# Patient Record
Sex: Male | Born: 1940 | Race: White | Hispanic: No | Marital: Married | Smoking: Never smoker
Health system: Southern US, Community
[De-identification: ages and names within clinical notes are randomized; demographics above are authoritative.]

## PROBLEM LIST (undated history)

## (undated) DIAGNOSIS — C61 Malignant neoplasm of prostate: Secondary | ICD-10-CM

## (undated) DIAGNOSIS — G4730 Sleep apnea, unspecified: Secondary | ICD-10-CM

## (undated) DIAGNOSIS — I10 Essential (primary) hypertension: Secondary | ICD-10-CM

## (undated) DIAGNOSIS — N189 Chronic kidney disease, unspecified: Secondary | ICD-10-CM

## (undated) DIAGNOSIS — I4510 Unspecified right bundle-branch block: Secondary | ICD-10-CM

## (undated) DIAGNOSIS — E785 Hyperlipidemia, unspecified: Secondary | ICD-10-CM

## (undated) DIAGNOSIS — H269 Unspecified cataract: Secondary | ICD-10-CM

## (undated) HISTORY — DX: Malignant neoplasm of prostate: C61

## (undated) HISTORY — DX: Essential (primary) hypertension: I10

## (undated) HISTORY — DX: Unspecified cataract: H26.9

## (undated) HISTORY — DX: Hyperlipidemia, unspecified: E78.5

## (undated) HISTORY — DX: Chronic kidney disease, unspecified: N18.9

## (undated) HISTORY — DX: Sleep apnea, unspecified: G47.30

---

## 2003-08-19 HISTORY — PX: PROSTATE SURGERY: SHX751

## 2015-09-18 DIAGNOSIS — I10 Essential (primary) hypertension: Secondary | ICD-10-CM | POA: Insufficient documentation

## 2015-09-18 DIAGNOSIS — J309 Allergic rhinitis, unspecified: Secondary | ICD-10-CM | POA: Insufficient documentation

## 2015-09-18 DIAGNOSIS — C61 Malignant neoplasm of prostate: Secondary | ICD-10-CM | POA: Insufficient documentation

## 2015-09-18 DIAGNOSIS — E7800 Pure hypercholesterolemia, unspecified: Secondary | ICD-10-CM | POA: Insufficient documentation

## 2015-09-18 DIAGNOSIS — K2100 Gastro-esophageal reflux disease with esophagitis, without bleeding: Secondary | ICD-10-CM | POA: Insufficient documentation

## 2015-09-18 DIAGNOSIS — R7301 Impaired fasting glucose: Secondary | ICD-10-CM | POA: Insufficient documentation

## 2016-08-20 DIAGNOSIS — R69 Illness, unspecified: Secondary | ICD-10-CM | POA: Diagnosis not present

## 2016-10-23 DIAGNOSIS — Z08 Encounter for follow-up examination after completed treatment for malignant neoplasm: Secondary | ICD-10-CM | POA: Diagnosis not present

## 2016-10-23 DIAGNOSIS — Z85828 Personal history of other malignant neoplasm of skin: Secondary | ICD-10-CM | POA: Diagnosis not present

## 2017-03-24 DIAGNOSIS — Z85828 Personal history of other malignant neoplasm of skin: Secondary | ICD-10-CM | POA: Diagnosis not present

## 2017-03-24 DIAGNOSIS — L820 Inflamed seborrheic keratosis: Secondary | ICD-10-CM | POA: Diagnosis not present

## 2017-03-24 DIAGNOSIS — Z08 Encounter for follow-up examination after completed treatment for malignant neoplasm: Secondary | ICD-10-CM | POA: Diagnosis not present

## 2017-03-24 DIAGNOSIS — L570 Actinic keratosis: Secondary | ICD-10-CM | POA: Diagnosis not present

## 2017-03-31 DIAGNOSIS — I10 Essential (primary) hypertension: Secondary | ICD-10-CM | POA: Diagnosis not present

## 2017-03-31 DIAGNOSIS — Z0000 Encounter for general adult medical examination without abnormal findings: Secondary | ICD-10-CM | POA: Diagnosis not present

## 2017-03-31 DIAGNOSIS — E7800 Pure hypercholesterolemia, unspecified: Secondary | ICD-10-CM | POA: Diagnosis not present

## 2017-04-03 DIAGNOSIS — Z23 Encounter for immunization: Secondary | ICD-10-CM | POA: Diagnosis not present

## 2017-04-03 DIAGNOSIS — E7800 Pure hypercholesterolemia, unspecified: Secondary | ICD-10-CM | POA: Diagnosis not present

## 2017-04-03 DIAGNOSIS — Z79899 Other long term (current) drug therapy: Secondary | ICD-10-CM | POA: Diagnosis not present

## 2017-04-03 DIAGNOSIS — Z8546 Personal history of malignant neoplasm of prostate: Secondary | ICD-10-CM | POA: Diagnosis not present

## 2017-04-03 DIAGNOSIS — R69 Illness, unspecified: Secondary | ICD-10-CM | POA: Diagnosis not present

## 2017-04-03 DIAGNOSIS — I10 Essential (primary) hypertension: Secondary | ICD-10-CM | POA: Diagnosis not present

## 2017-04-03 DIAGNOSIS — J302 Other seasonal allergic rhinitis: Secondary | ICD-10-CM | POA: Diagnosis not present

## 2017-04-03 DIAGNOSIS — K210 Gastro-esophageal reflux disease with esophagitis: Secondary | ICD-10-CM | POA: Diagnosis not present

## 2017-04-03 DIAGNOSIS — R7301 Impaired fasting glucose: Secondary | ICD-10-CM | POA: Diagnosis not present

## 2017-04-03 DIAGNOSIS — Z0000 Encounter for general adult medical examination without abnormal findings: Secondary | ICD-10-CM | POA: Diagnosis not present

## 2017-08-20 DIAGNOSIS — R69 Illness, unspecified: Secondary | ICD-10-CM | POA: Diagnosis not present

## 2017-10-05 DIAGNOSIS — H524 Presbyopia: Secondary | ICD-10-CM | POA: Diagnosis not present

## 2017-10-05 DIAGNOSIS — H25043 Posterior subcapsular polar age-related cataract, bilateral: Secondary | ICD-10-CM | POA: Diagnosis not present

## 2017-10-05 DIAGNOSIS — H52203 Unspecified astigmatism, bilateral: Secondary | ICD-10-CM | POA: Diagnosis not present

## 2017-10-05 DIAGNOSIS — H2513 Age-related nuclear cataract, bilateral: Secondary | ICD-10-CM | POA: Diagnosis not present

## 2017-10-05 DIAGNOSIS — H5213 Myopia, bilateral: Secondary | ICD-10-CM | POA: Diagnosis not present

## 2017-10-12 DIAGNOSIS — Z0100 Encounter for examination of eyes and vision without abnormal findings: Secondary | ICD-10-CM | POA: Diagnosis not present

## 2017-12-05 DIAGNOSIS — J208 Acute bronchitis due to other specified organisms: Secondary | ICD-10-CM | POA: Diagnosis not present

## 2017-12-05 DIAGNOSIS — B9689 Other specified bacterial agents as the cause of diseases classified elsewhere: Secondary | ICD-10-CM | POA: Diagnosis not present

## 2018-01-19 DIAGNOSIS — Z85828 Personal history of other malignant neoplasm of skin: Secondary | ICD-10-CM | POA: Diagnosis not present

## 2018-01-19 DIAGNOSIS — Z08 Encounter for follow-up examination after completed treatment for malignant neoplasm: Secondary | ICD-10-CM | POA: Diagnosis not present

## 2018-01-19 DIAGNOSIS — Z86008 Personal history of in-situ neoplasm of other site: Secondary | ICD-10-CM | POA: Diagnosis not present

## 2018-01-19 DIAGNOSIS — L821 Other seborrheic keratosis: Secondary | ICD-10-CM | POA: Diagnosis not present

## 2018-01-19 DIAGNOSIS — L720 Epidermal cyst: Secondary | ICD-10-CM | POA: Diagnosis not present

## 2018-01-19 DIAGNOSIS — L570 Actinic keratosis: Secondary | ICD-10-CM | POA: Diagnosis not present

## 2018-03-04 DIAGNOSIS — M9904 Segmental and somatic dysfunction of sacral region: Secondary | ICD-10-CM | POA: Diagnosis not present

## 2018-03-04 DIAGNOSIS — S335XXA Sprain of ligaments of lumbar spine, initial encounter: Secondary | ICD-10-CM | POA: Diagnosis not present

## 2018-03-04 DIAGNOSIS — M9903 Segmental and somatic dysfunction of lumbar region: Secondary | ICD-10-CM | POA: Diagnosis not present

## 2018-03-04 DIAGNOSIS — M9902 Segmental and somatic dysfunction of thoracic region: Secondary | ICD-10-CM | POA: Diagnosis not present

## 2018-03-08 DIAGNOSIS — M9902 Segmental and somatic dysfunction of thoracic region: Secondary | ICD-10-CM | POA: Diagnosis not present

## 2018-03-08 DIAGNOSIS — S335XXA Sprain of ligaments of lumbar spine, initial encounter: Secondary | ICD-10-CM | POA: Diagnosis not present

## 2018-03-08 DIAGNOSIS — M9903 Segmental and somatic dysfunction of lumbar region: Secondary | ICD-10-CM | POA: Diagnosis not present

## 2018-03-08 DIAGNOSIS — M9904 Segmental and somatic dysfunction of sacral region: Secondary | ICD-10-CM | POA: Diagnosis not present

## 2018-03-15 DIAGNOSIS — S335XXA Sprain of ligaments of lumbar spine, initial encounter: Secondary | ICD-10-CM | POA: Diagnosis not present

## 2018-03-15 DIAGNOSIS — M9902 Segmental and somatic dysfunction of thoracic region: Secondary | ICD-10-CM | POA: Diagnosis not present

## 2018-03-15 DIAGNOSIS — M9903 Segmental and somatic dysfunction of lumbar region: Secondary | ICD-10-CM | POA: Diagnosis not present

## 2018-03-15 DIAGNOSIS — M9904 Segmental and somatic dysfunction of sacral region: Secondary | ICD-10-CM | POA: Diagnosis not present

## 2018-03-18 DIAGNOSIS — S335XXA Sprain of ligaments of lumbar spine, initial encounter: Secondary | ICD-10-CM | POA: Diagnosis not present

## 2018-03-18 DIAGNOSIS — M9902 Segmental and somatic dysfunction of thoracic region: Secondary | ICD-10-CM | POA: Diagnosis not present

## 2018-03-18 DIAGNOSIS — M9903 Segmental and somatic dysfunction of lumbar region: Secondary | ICD-10-CM | POA: Diagnosis not present

## 2018-03-18 DIAGNOSIS — M9904 Segmental and somatic dysfunction of sacral region: Secondary | ICD-10-CM | POA: Diagnosis not present

## 2018-03-23 DIAGNOSIS — M9903 Segmental and somatic dysfunction of lumbar region: Secondary | ICD-10-CM | POA: Diagnosis not present

## 2018-03-23 DIAGNOSIS — S335XXA Sprain of ligaments of lumbar spine, initial encounter: Secondary | ICD-10-CM | POA: Diagnosis not present

## 2018-03-23 DIAGNOSIS — M9902 Segmental and somatic dysfunction of thoracic region: Secondary | ICD-10-CM | POA: Diagnosis not present

## 2018-03-23 DIAGNOSIS — M9904 Segmental and somatic dysfunction of sacral region: Secondary | ICD-10-CM | POA: Diagnosis not present

## 2018-03-29 DIAGNOSIS — M9904 Segmental and somatic dysfunction of sacral region: Secondary | ICD-10-CM | POA: Diagnosis not present

## 2018-03-29 DIAGNOSIS — M9903 Segmental and somatic dysfunction of lumbar region: Secondary | ICD-10-CM | POA: Diagnosis not present

## 2018-03-29 DIAGNOSIS — M9902 Segmental and somatic dysfunction of thoracic region: Secondary | ICD-10-CM | POA: Diagnosis not present

## 2018-03-29 DIAGNOSIS — S335XXA Sprain of ligaments of lumbar spine, initial encounter: Secondary | ICD-10-CM | POA: Diagnosis not present

## 2018-06-08 DIAGNOSIS — I10 Essential (primary) hypertension: Secondary | ICD-10-CM | POA: Diagnosis not present

## 2018-06-08 DIAGNOSIS — Z0000 Encounter for general adult medical examination without abnormal findings: Secondary | ICD-10-CM | POA: Diagnosis not present

## 2018-06-08 DIAGNOSIS — C61 Malignant neoplasm of prostate: Secondary | ICD-10-CM | POA: Diagnosis not present

## 2018-06-08 DIAGNOSIS — R7301 Impaired fasting glucose: Secondary | ICD-10-CM | POA: Diagnosis not present

## 2018-06-08 DIAGNOSIS — K210 Gastro-esophageal reflux disease with esophagitis: Secondary | ICD-10-CM | POA: Diagnosis not present

## 2018-06-08 DIAGNOSIS — J302 Other seasonal allergic rhinitis: Secondary | ICD-10-CM | POA: Diagnosis not present

## 2018-06-08 DIAGNOSIS — E7800 Pure hypercholesterolemia, unspecified: Secondary | ICD-10-CM | POA: Diagnosis not present

## 2018-06-08 DIAGNOSIS — R69 Illness, unspecified: Secondary | ICD-10-CM | POA: Diagnosis not present

## 2018-06-15 DIAGNOSIS — I10 Essential (primary) hypertension: Secondary | ICD-10-CM | POA: Diagnosis not present

## 2018-06-15 DIAGNOSIS — Z0000 Encounter for general adult medical examination without abnormal findings: Secondary | ICD-10-CM | POA: Diagnosis not present

## 2018-06-15 DIAGNOSIS — E7800 Pure hypercholesterolemia, unspecified: Secondary | ICD-10-CM | POA: Diagnosis not present

## 2018-07-26 DIAGNOSIS — D485 Neoplasm of uncertain behavior of skin: Secondary | ICD-10-CM | POA: Diagnosis not present

## 2018-07-26 DIAGNOSIS — L905 Scar conditions and fibrosis of skin: Secondary | ICD-10-CM | POA: Diagnosis not present

## 2018-08-19 DIAGNOSIS — R69 Illness, unspecified: Secondary | ICD-10-CM | POA: Diagnosis not present

## 2018-10-07 DIAGNOSIS — H5213 Myopia, bilateral: Secondary | ICD-10-CM | POA: Diagnosis not present

## 2018-10-07 DIAGNOSIS — W19XXXA Unspecified fall, initial encounter: Secondary | ICD-10-CM | POA: Diagnosis not present

## 2018-10-07 DIAGNOSIS — H11153 Pinguecula, bilateral: Secondary | ICD-10-CM | POA: Diagnosis not present

## 2018-10-07 DIAGNOSIS — H52203 Unspecified astigmatism, bilateral: Secondary | ICD-10-CM | POA: Diagnosis not present

## 2018-10-07 DIAGNOSIS — R61 Generalized hyperhidrosis: Secondary | ICD-10-CM | POA: Diagnosis not present

## 2018-10-07 DIAGNOSIS — H2513 Age-related nuclear cataract, bilateral: Secondary | ICD-10-CM | POA: Diagnosis not present

## 2018-10-07 DIAGNOSIS — H524 Presbyopia: Secondary | ICD-10-CM | POA: Diagnosis not present

## 2018-10-07 DIAGNOSIS — H25043 Posterior subcapsular polar age-related cataract, bilateral: Secondary | ICD-10-CM | POA: Diagnosis not present

## 2018-10-12 DIAGNOSIS — R55 Syncope and collapse: Secondary | ICD-10-CM | POA: Insufficient documentation

## 2018-11-08 DIAGNOSIS — R55 Syncope and collapse: Secondary | ICD-10-CM | POA: Diagnosis not present

## 2018-11-08 DIAGNOSIS — R0681 Apnea, not elsewhere classified: Secondary | ICD-10-CM | POA: Insufficient documentation

## 2018-11-17 DIAGNOSIS — R55 Syncope and collapse: Secondary | ICD-10-CM | POA: Diagnosis not present

## 2018-11-17 DIAGNOSIS — G4733 Obstructive sleep apnea (adult) (pediatric): Secondary | ICD-10-CM | POA: Diagnosis not present

## 2018-11-20 DIAGNOSIS — G4733 Obstructive sleep apnea (adult) (pediatric): Secondary | ICD-10-CM | POA: Insufficient documentation

## 2018-11-22 DIAGNOSIS — C61 Malignant neoplasm of prostate: Secondary | ICD-10-CM | POA: Diagnosis not present

## 2018-11-22 DIAGNOSIS — R55 Syncope and collapse: Secondary | ICD-10-CM | POA: Diagnosis not present

## 2018-11-22 DIAGNOSIS — I10 Essential (primary) hypertension: Secondary | ICD-10-CM | POA: Diagnosis not present

## 2018-11-22 DIAGNOSIS — E7800 Pure hypercholesterolemia, unspecified: Secondary | ICD-10-CM | POA: Diagnosis not present

## 2018-11-22 DIAGNOSIS — G4733 Obstructive sleep apnea (adult) (pediatric): Secondary | ICD-10-CM | POA: Diagnosis not present

## 2018-11-29 DIAGNOSIS — R55 Syncope and collapse: Secondary | ICD-10-CM | POA: Diagnosis not present

## 2018-12-03 DIAGNOSIS — R55 Syncope and collapse: Secondary | ICD-10-CM | POA: Diagnosis not present

## 2019-01-19 DIAGNOSIS — R55 Syncope and collapse: Secondary | ICD-10-CM | POA: Diagnosis not present

## 2019-01-19 DIAGNOSIS — G4733 Obstructive sleep apnea (adult) (pediatric): Secondary | ICD-10-CM | POA: Diagnosis not present

## 2019-01-19 DIAGNOSIS — K210 Gastro-esophageal reflux disease with esophagitis: Secondary | ICD-10-CM | POA: Diagnosis not present

## 2019-01-19 DIAGNOSIS — C61 Malignant neoplasm of prostate: Secondary | ICD-10-CM | POA: Diagnosis not present

## 2019-01-19 DIAGNOSIS — I10 Essential (primary) hypertension: Secondary | ICD-10-CM | POA: Diagnosis not present

## 2019-01-21 DIAGNOSIS — Z20828 Contact with and (suspected) exposure to other viral communicable diseases: Secondary | ICD-10-CM | POA: Diagnosis not present

## 2019-02-14 DIAGNOSIS — S335XXA Sprain of ligaments of lumbar spine, initial encounter: Secondary | ICD-10-CM | POA: Diagnosis not present

## 2019-02-14 DIAGNOSIS — M9902 Segmental and somatic dysfunction of thoracic region: Secondary | ICD-10-CM | POA: Diagnosis not present

## 2019-02-14 DIAGNOSIS — M9903 Segmental and somatic dysfunction of lumbar region: Secondary | ICD-10-CM | POA: Diagnosis not present

## 2019-02-14 DIAGNOSIS — M9904 Segmental and somatic dysfunction of sacral region: Secondary | ICD-10-CM | POA: Diagnosis not present

## 2019-02-15 DIAGNOSIS — M9902 Segmental and somatic dysfunction of thoracic region: Secondary | ICD-10-CM | POA: Diagnosis not present

## 2019-02-15 DIAGNOSIS — M9904 Segmental and somatic dysfunction of sacral region: Secondary | ICD-10-CM | POA: Diagnosis not present

## 2019-02-15 DIAGNOSIS — S335XXA Sprain of ligaments of lumbar spine, initial encounter: Secondary | ICD-10-CM | POA: Diagnosis not present

## 2019-02-15 DIAGNOSIS — M9903 Segmental and somatic dysfunction of lumbar region: Secondary | ICD-10-CM | POA: Diagnosis not present

## 2019-02-16 DIAGNOSIS — M9904 Segmental and somatic dysfunction of sacral region: Secondary | ICD-10-CM | POA: Diagnosis not present

## 2019-02-16 DIAGNOSIS — M9902 Segmental and somatic dysfunction of thoracic region: Secondary | ICD-10-CM | POA: Diagnosis not present

## 2019-02-16 DIAGNOSIS — M9903 Segmental and somatic dysfunction of lumbar region: Secondary | ICD-10-CM | POA: Diagnosis not present

## 2019-02-16 DIAGNOSIS — S335XXA Sprain of ligaments of lumbar spine, initial encounter: Secondary | ICD-10-CM | POA: Diagnosis not present

## 2019-02-17 DIAGNOSIS — M9903 Segmental and somatic dysfunction of lumbar region: Secondary | ICD-10-CM | POA: Diagnosis not present

## 2019-02-17 DIAGNOSIS — M9904 Segmental and somatic dysfunction of sacral region: Secondary | ICD-10-CM | POA: Diagnosis not present

## 2019-02-17 DIAGNOSIS — S335XXA Sprain of ligaments of lumbar spine, initial encounter: Secondary | ICD-10-CM | POA: Diagnosis not present

## 2019-02-17 DIAGNOSIS — M9902 Segmental and somatic dysfunction of thoracic region: Secondary | ICD-10-CM | POA: Diagnosis not present

## 2019-02-21 DIAGNOSIS — M9903 Segmental and somatic dysfunction of lumbar region: Secondary | ICD-10-CM | POA: Diagnosis not present

## 2019-02-21 DIAGNOSIS — S335XXA Sprain of ligaments of lumbar spine, initial encounter: Secondary | ICD-10-CM | POA: Diagnosis not present

## 2019-02-21 DIAGNOSIS — M9902 Segmental and somatic dysfunction of thoracic region: Secondary | ICD-10-CM | POA: Diagnosis not present

## 2019-02-21 DIAGNOSIS — M9904 Segmental and somatic dysfunction of sacral region: Secondary | ICD-10-CM | POA: Diagnosis not present

## 2019-02-24 DIAGNOSIS — S335XXA Sprain of ligaments of lumbar spine, initial encounter: Secondary | ICD-10-CM | POA: Diagnosis not present

## 2019-02-24 DIAGNOSIS — M9904 Segmental and somatic dysfunction of sacral region: Secondary | ICD-10-CM | POA: Diagnosis not present

## 2019-02-24 DIAGNOSIS — M9903 Segmental and somatic dysfunction of lumbar region: Secondary | ICD-10-CM | POA: Diagnosis not present

## 2019-02-24 DIAGNOSIS — M9902 Segmental and somatic dysfunction of thoracic region: Secondary | ICD-10-CM | POA: Diagnosis not present

## 2019-03-01 DIAGNOSIS — M5127 Other intervertebral disc displacement, lumbosacral region: Secondary | ICD-10-CM | POA: Diagnosis not present

## 2019-03-01 DIAGNOSIS — M5416 Radiculopathy, lumbar region: Secondary | ICD-10-CM | POA: Diagnosis not present

## 2019-03-01 DIAGNOSIS — M549 Dorsalgia, unspecified: Secondary | ICD-10-CM | POA: Diagnosis not present

## 2019-03-01 DIAGNOSIS — M5136 Other intervertebral disc degeneration, lumbar region: Secondary | ICD-10-CM | POA: Diagnosis not present

## 2019-03-01 DIAGNOSIS — G8929 Other chronic pain: Secondary | ICD-10-CM | POA: Diagnosis not present

## 2019-03-01 DIAGNOSIS — M62830 Muscle spasm of back: Secondary | ICD-10-CM | POA: Diagnosis not present

## 2019-03-09 DIAGNOSIS — Z0100 Encounter for examination of eyes and vision without abnormal findings: Secondary | ICD-10-CM | POA: Diagnosis not present

## 2019-03-15 DIAGNOSIS — G8929 Other chronic pain: Secondary | ICD-10-CM | POA: Diagnosis not present

## 2019-03-15 DIAGNOSIS — M5127 Other intervertebral disc displacement, lumbosacral region: Secondary | ICD-10-CM | POA: Diagnosis not present

## 2019-03-15 DIAGNOSIS — M5136 Other intervertebral disc degeneration, lumbar region: Secondary | ICD-10-CM | POA: Diagnosis not present

## 2019-03-15 DIAGNOSIS — M62830 Muscle spasm of back: Secondary | ICD-10-CM | POA: Diagnosis not present

## 2019-03-15 DIAGNOSIS — M549 Dorsalgia, unspecified: Secondary | ICD-10-CM | POA: Diagnosis not present

## 2019-03-15 DIAGNOSIS — M5416 Radiculopathy, lumbar region: Secondary | ICD-10-CM | POA: Diagnosis not present

## 2019-04-01 DIAGNOSIS — R55 Syncope and collapse: Secondary | ICD-10-CM | POA: Diagnosis not present

## 2019-04-01 DIAGNOSIS — G8929 Other chronic pain: Secondary | ICD-10-CM | POA: Insufficient documentation

## 2019-04-01 DIAGNOSIS — M5441 Lumbago with sciatica, right side: Secondary | ICD-10-CM | POA: Diagnosis not present

## 2019-04-01 DIAGNOSIS — M5440 Lumbago with sciatica, unspecified side: Secondary | ICD-10-CM | POA: Insufficient documentation

## 2019-04-12 DIAGNOSIS — M5136 Other intervertebral disc degeneration, lumbar region: Secondary | ICD-10-CM | POA: Diagnosis not present

## 2019-04-12 DIAGNOSIS — M62830 Muscle spasm of back: Secondary | ICD-10-CM | POA: Diagnosis not present

## 2019-04-12 DIAGNOSIS — M5416 Radiculopathy, lumbar region: Secondary | ICD-10-CM | POA: Diagnosis not present

## 2019-04-12 DIAGNOSIS — G8929 Other chronic pain: Secondary | ICD-10-CM | POA: Diagnosis not present

## 2019-04-12 DIAGNOSIS — M5127 Other intervertebral disc displacement, lumbosacral region: Secondary | ICD-10-CM | POA: Diagnosis not present

## 2019-04-12 DIAGNOSIS — M549 Dorsalgia, unspecified: Secondary | ICD-10-CM | POA: Diagnosis not present

## 2019-04-30 DIAGNOSIS — M5136 Other intervertebral disc degeneration, lumbar region: Secondary | ICD-10-CM | POA: Diagnosis not present

## 2019-04-30 DIAGNOSIS — M549 Dorsalgia, unspecified: Secondary | ICD-10-CM | POA: Diagnosis not present

## 2019-04-30 DIAGNOSIS — G8929 Other chronic pain: Secondary | ICD-10-CM | POA: Diagnosis not present

## 2019-04-30 DIAGNOSIS — M545 Low back pain: Secondary | ICD-10-CM | POA: Diagnosis not present

## 2019-04-30 DIAGNOSIS — M5416 Radiculopathy, lumbar region: Secondary | ICD-10-CM | POA: Diagnosis not present

## 2019-04-30 DIAGNOSIS — M5127 Other intervertebral disc displacement, lumbosacral region: Secondary | ICD-10-CM | POA: Diagnosis not present

## 2019-04-30 DIAGNOSIS — M62830 Muscle spasm of back: Secondary | ICD-10-CM | POA: Diagnosis not present

## 2019-05-19 DIAGNOSIS — M62830 Muscle spasm of back: Secondary | ICD-10-CM | POA: Diagnosis not present

## 2019-05-19 DIAGNOSIS — M545 Low back pain: Secondary | ICD-10-CM | POA: Diagnosis not present

## 2019-06-08 DIAGNOSIS — M5136 Other intervertebral disc degeneration, lumbar region: Secondary | ICD-10-CM | POA: Insufficient documentation

## 2019-06-08 DIAGNOSIS — I10 Essential (primary) hypertension: Secondary | ICD-10-CM | POA: Diagnosis not present

## 2019-06-08 DIAGNOSIS — M51369 Other intervertebral disc degeneration, lumbar region without mention of lumbar back pain or lower extremity pain: Secondary | ICD-10-CM | POA: Insufficient documentation

## 2019-06-08 DIAGNOSIS — M48061 Spinal stenosis, lumbar region without neurogenic claudication: Secondary | ICD-10-CM | POA: Insufficient documentation

## 2019-06-08 DIAGNOSIS — M47896 Other spondylosis, lumbar region: Secondary | ICD-10-CM | POA: Diagnosis not present

## 2019-06-08 DIAGNOSIS — Z791 Long term (current) use of non-steroidal anti-inflammatories (NSAID): Secondary | ICD-10-CM | POA: Diagnosis not present

## 2019-06-08 DIAGNOSIS — M5126 Other intervertebral disc displacement, lumbar region: Secondary | ICD-10-CM | POA: Diagnosis not present

## 2019-06-08 DIAGNOSIS — Z79899 Other long term (current) drug therapy: Secondary | ICD-10-CM | POA: Diagnosis not present

## 2019-06-08 DIAGNOSIS — M5416 Radiculopathy, lumbar region: Secondary | ICD-10-CM | POA: Diagnosis not present

## 2019-06-08 DIAGNOSIS — M47816 Spondylosis without myelopathy or radiculopathy, lumbar region: Secondary | ICD-10-CM | POA: Insufficient documentation

## 2019-06-13 DIAGNOSIS — C61 Malignant neoplasm of prostate: Secondary | ICD-10-CM | POA: Diagnosis not present

## 2019-06-13 DIAGNOSIS — J302 Other seasonal allergic rhinitis: Secondary | ICD-10-CM | POA: Diagnosis not present

## 2019-06-13 DIAGNOSIS — E7800 Pure hypercholesterolemia, unspecified: Secondary | ICD-10-CM | POA: Diagnosis not present

## 2019-06-13 DIAGNOSIS — E785 Hyperlipidemia, unspecified: Secondary | ICD-10-CM | POA: Diagnosis not present

## 2019-06-13 DIAGNOSIS — R7301 Impaired fasting glucose: Secondary | ICD-10-CM | POA: Diagnosis not present

## 2019-06-13 DIAGNOSIS — K2100 Gastro-esophageal reflux disease with esophagitis, without bleeding: Secondary | ICD-10-CM | POA: Diagnosis not present

## 2019-06-13 DIAGNOSIS — Z0000 Encounter for general adult medical examination without abnormal findings: Secondary | ICD-10-CM | POA: Diagnosis not present

## 2019-06-13 DIAGNOSIS — I10 Essential (primary) hypertension: Secondary | ICD-10-CM | POA: Diagnosis not present

## 2019-06-13 DIAGNOSIS — R69 Illness, unspecified: Secondary | ICD-10-CM | POA: Diagnosis not present

## 2019-07-08 ENCOUNTER — Non-Acute Institutional Stay: Payer: Medicare HMO | Admitting: Internal Medicine

## 2019-07-08 ENCOUNTER — Encounter: Payer: Self-pay | Admitting: Internal Medicine

## 2019-07-08 ENCOUNTER — Other Ambulatory Visit: Payer: Self-pay

## 2019-07-08 VITALS — BP 146/72 | HR 73 | Temp 97.3°F | Wt 189.6 lb

## 2019-07-08 DIAGNOSIS — M5136 Other intervertebral disc degeneration, lumbar region: Secondary | ICD-10-CM

## 2019-07-08 DIAGNOSIS — E7800 Pure hypercholesterolemia, unspecified: Secondary | ICD-10-CM | POA: Diagnosis not present

## 2019-07-08 DIAGNOSIS — R635 Abnormal weight gain: Secondary | ICD-10-CM | POA: Diagnosis not present

## 2019-07-08 DIAGNOSIS — C61 Malignant neoplasm of prostate: Secondary | ICD-10-CM | POA: Diagnosis not present

## 2019-07-08 DIAGNOSIS — R55 Syncope and collapse: Secondary | ICD-10-CM | POA: Diagnosis not present

## 2019-07-08 DIAGNOSIS — Z7189 Other specified counseling: Secondary | ICD-10-CM | POA: Insufficient documentation

## 2019-07-08 DIAGNOSIS — Z20822 Contact with and (suspected) exposure to covid-19: Secondary | ICD-10-CM

## 2019-07-08 DIAGNOSIS — I10 Essential (primary) hypertension: Secondary | ICD-10-CM

## 2019-07-08 NOTE — Progress Notes (Signed)
Location:  [place] of Service:  Clinic (12)  Provider:   Code Status:  Goals of Care: No flowsheet data found.   Chief Complaint  Patient presents with   Medical Management of Chronic Issues    Patient here to establish care    HPI: Patient is a 78 y.o. male seen today for medical management of chronic diseases.   Patient here to estabilish care  H/o Low back pain New diagnosis Was on NSAID high dose. 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 Loose control of BP  Hypertension Loose control due to above.   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 Very active No Cane or Walker. No Falls. Still drive Past Medical History:  Diagnosis Date   Hypertension    Prostate cancer [facility])     Past Surgical History:  Procedure Laterality Date   PROSTATE SURGERY  2005   [name]    No Known Allergies  Outpatient Encounter Medications as of 07/08/2019  Medication Sig   amLODipine (NORVASC) 5 MG tablet Take 5 mg by mouth daily.   atorvastatin (LIPITOR) 10 MG tablet Take 10 mg by mouth daily.   lisinopril (ZESTRIL) 20 MG tablet Take 20 mg by mouth daily.   No facility-administered encounter medications on file as of 07/08/2019.     Review of Systems:  Review of Systems  Review of Systems  Constitutional: Negative for activity change, appetite change, chills, diaphoresis, fatigue and fever.  HENT:  Negative for mouth sores, postnasal drip, rhinorrhea, sinus pain and sore throat.   Respiratory: Negative for apnea, cough, chest tightness, shortness of breath and wheezing.   Cardiovascular: Negative for chest pain, palpitations and leg swelling.  Gastrointestinal: Negative for abdominal distention, abdominal pain, constipation, diarrhea, nausea and vomiting.  Genitourinary: Negative for dysuria and frequency.  Musculoskeletal: Negative for arthralgias, joint swelling and myalgias.  Skin: Negative for rash.  Neurological: Negative for weakness, light-headedness and numbness.  Psychiatric/Behavioral: Negative for behavioral problems, confusion and sleep disturbance.     Health Maintenance  Topic Date Due   TETANUS/TDAP  04/05/1962   PNA vac Low Risk Adult (1 of 2 - PCV13) 04/05/2008   INFLUENZA VACCINE  03/19/2019    Physical Exam: Vitals:   07/08/19 1020  BP: (!) 146/72  Pulse: 73  Temp: (!) 97.3 F (36.3 C)  SpO2: 96%  Weight: 189 lb 9.6 oz (86 kg)   There is no height or weight on file to calculate BMI. Physical Exam  Constitutional: Oriented to person, place, and time. Well-developed and well-nourished.  HENT:  Head: Normocephalic.  Mouth/Throat: Oropharynx is clear and moist.  Eyes: Pupils are equal, round, and reactive to light.  Neck: Neck supple.  Cardiovascular: Normal rate and normal heart sounds.  No murmur heard. Pulmonary/Chest: Effort normal and breath sounds normal. No respiratory distress. No wheezes. She has no rales.  Abdominal: Soft. Bowel sounds are  normal. No distension. There is no tenderness. There is no rebound.  Musculoskeletal: Mild Edema Bilateral Left more then right Lymphadenopathy: none Neurological: Alert and oriented to person, place, and time.  Skin: Skin is warm and dry.  Psychiatric: Normal mood and affect. Behavior is normal. Thought content normal.    Labs reviewed: Basic Metabolic Panel: No results for input(s): NA, K, CL,  CO2, GLUCOSE, BUN, CREATININE, CALCIUM, MG, PHOS, TSH in the last 8760 hours. Liver Function Tests: No results for input(s): AST, ALT, ALKPHOS, BILITOT, PROT, ALBUMIN in the last 8760 hours. No results for input(s): LIPASE, AMYLASE in the last 8760 hours. No results for input(s): AMMONIA in the last 8760 hours. CBC: No results for input(s): WBC, NEUTROABS, HGB, HCT, MCV, PLT in the last 8760 hours. Lipid Panel: No results for input(s): CHOL, HDL, LDLCALC, TRIG, CHOLHDL, LDLDIRECT in the last 8760 hours. No results found for: HGBA1C  Procedures since last visit: No results found.  Assessment/Plan H/O Syncope and collapse Follows with Cardiology Continue follow up with them Was taken off HCTZ and has not had any more episodes recently He and his wife Know what to do and are comfortable with his episodes  Essential hypertension Loose control of BP due to above Doing well Will continue monitor his BP Q weekly  Lumbar degenerative disc disease Doing better since his steroid shots Will follow up with [facility] to walk and will work out in [employer] over not using NSAID chronically  H/O Malignant neoplasm of prostate ([place]) 10 years ago S/P Surgery PSA has been Normal  Hypercholesterolemia Last LDL 90 Mild Thrombocytopenia Will repeat CBC Not on Aspirin Weight gain Was due to No exercising Is going to restart  ACP (advance care planning) Patient wanted to make sure we knew he does nto believe in Prolonging his life when he would be too Disable to advocate for himself. Wants to make sure he gets to choose his treatment options     Labs/tests ordered:  * No order type specified * Next appt:  Visit date not found   Total time spent in this patient care encounter was  45_  minutes; greater than 50% of the visit spent counseling patient and staff, reviewing records , Labs and coordinating care for problems addressed at this encounter.

## 2019-07-11 LAB — NOVEL CORONAVIRUS, NAA: SARS-CoV-2, NAA: NOT DETECTED

## 2019-07-19 DIAGNOSIS — M79671 Pain in right foot: Secondary | ICD-10-CM | POA: Diagnosis not present

## 2019-07-19 DIAGNOSIS — Q6689 Other  specified congenital deformities of feet: Secondary | ICD-10-CM | POA: Diagnosis not present

## 2019-07-19 DIAGNOSIS — B351 Tinea unguium: Secondary | ICD-10-CM | POA: Diagnosis not present

## 2019-07-19 DIAGNOSIS — L84 Corns and callosities: Secondary | ICD-10-CM | POA: Diagnosis not present

## 2019-07-19 DIAGNOSIS — L814 Other melanin hyperpigmentation: Secondary | ICD-10-CM | POA: Diagnosis not present

## 2019-07-19 DIAGNOSIS — L570 Actinic keratosis: Secondary | ICD-10-CM | POA: Diagnosis not present

## 2019-07-19 DIAGNOSIS — D485 Neoplasm of uncertain behavior of skin: Secondary | ICD-10-CM | POA: Diagnosis not present

## 2019-07-19 DIAGNOSIS — C44311 Basal cell carcinoma of skin of nose: Secondary | ICD-10-CM | POA: Diagnosis not present

## 2019-07-19 DIAGNOSIS — L821 Other seborrheic keratosis: Secondary | ICD-10-CM | POA: Diagnosis not present

## 2019-07-19 DIAGNOSIS — M79672 Pain in left foot: Secondary | ICD-10-CM | POA: Diagnosis not present

## 2019-07-19 DIAGNOSIS — D1801 Hemangioma of skin and subcutaneous tissue: Secondary | ICD-10-CM | POA: Diagnosis not present

## 2019-08-05 DIAGNOSIS — H524 Presbyopia: Secondary | ICD-10-CM | POA: Diagnosis not present

## 2019-08-05 DIAGNOSIS — H52203 Unspecified astigmatism, bilateral: Secondary | ICD-10-CM | POA: Diagnosis not present

## 2019-08-05 DIAGNOSIS — H2512 Age-related nuclear cataract, left eye: Secondary | ICD-10-CM | POA: Diagnosis not present

## 2019-08-05 DIAGNOSIS — H43811 Vitreous degeneration, right eye: Secondary | ICD-10-CM | POA: Diagnosis not present

## 2019-08-05 DIAGNOSIS — H25043 Posterior subcapsular polar age-related cataract, bilateral: Secondary | ICD-10-CM | POA: Diagnosis not present

## 2019-08-05 DIAGNOSIS — H5213 Myopia, bilateral: Secondary | ICD-10-CM | POA: Diagnosis not present

## 2019-08-05 DIAGNOSIS — H11153 Pinguecula, bilateral: Secondary | ICD-10-CM | POA: Diagnosis not present

## 2019-08-05 DIAGNOSIS — H2513 Age-related nuclear cataract, bilateral: Secondary | ICD-10-CM | POA: Diagnosis not present

## 2019-08-05 DIAGNOSIS — H25042 Posterior subcapsular polar age-related cataract, left eye: Secondary | ICD-10-CM | POA: Diagnosis not present

## 2019-08-16 ENCOUNTER — Encounter: Payer: Self-pay | Admitting: Internal Medicine

## 2019-08-16 NOTE — Telephone Encounter (Signed)
Message routed to Dr.Gupta

## 2019-08-25 DIAGNOSIS — Z01812 Encounter for preprocedural laboratory examination: Secondary | ICD-10-CM | POA: Diagnosis not present

## 2019-08-25 DIAGNOSIS — H2512 Age-related nuclear cataract, left eye: Secondary | ICD-10-CM | POA: Diagnosis not present

## 2019-08-25 DIAGNOSIS — Z20822 Contact with and (suspected) exposure to covid-19: Secondary | ICD-10-CM | POA: Diagnosis not present

## 2019-08-30 DIAGNOSIS — H25042 Posterior subcapsular polar age-related cataract, left eye: Secondary | ICD-10-CM | POA: Diagnosis not present

## 2019-08-30 DIAGNOSIS — H2512 Age-related nuclear cataract, left eye: Secondary | ICD-10-CM | POA: Diagnosis not present

## 2019-09-12 ENCOUNTER — Encounter: Payer: Self-pay | Admitting: Internal Medicine

## 2019-09-14 DIAGNOSIS — R69 Illness, unspecified: Secondary | ICD-10-CM | POA: Diagnosis not present

## 2019-09-26 DIAGNOSIS — H59032 Cystoid macular edema following cataract surgery, left eye: Secondary | ICD-10-CM | POA: Diagnosis not present

## 2019-09-26 DIAGNOSIS — H2511 Age-related nuclear cataract, right eye: Secondary | ICD-10-CM | POA: Diagnosis not present

## 2019-09-26 DIAGNOSIS — H25041 Posterior subcapsular polar age-related cataract, right eye: Secondary | ICD-10-CM | POA: Diagnosis not present

## 2019-10-04 DIAGNOSIS — H59032 Cystoid macular edema following cataract surgery, left eye: Secondary | ICD-10-CM | POA: Diagnosis not present

## 2019-10-25 DIAGNOSIS — L570 Actinic keratosis: Secondary | ICD-10-CM | POA: Diagnosis not present

## 2019-10-25 DIAGNOSIS — L820 Inflamed seborrheic keratosis: Secondary | ICD-10-CM | POA: Diagnosis not present

## 2019-10-25 DIAGNOSIS — L814 Other melanin hyperpigmentation: Secondary | ICD-10-CM | POA: Diagnosis not present

## 2019-10-25 DIAGNOSIS — D1801 Hemangioma of skin and subcutaneous tissue: Secondary | ICD-10-CM | POA: Diagnosis not present

## 2019-10-25 DIAGNOSIS — C44311 Basal cell carcinoma of skin of nose: Secondary | ICD-10-CM | POA: Diagnosis not present

## 2019-10-25 DIAGNOSIS — L859 Epidermal thickening, unspecified: Secondary | ICD-10-CM | POA: Diagnosis not present

## 2019-10-25 DIAGNOSIS — L821 Other seborrheic keratosis: Secondary | ICD-10-CM | POA: Diagnosis not present

## 2019-10-27 DIAGNOSIS — H59032 Cystoid macular edema following cataract surgery, left eye: Secondary | ICD-10-CM | POA: Diagnosis not present

## 2019-11-01 DIAGNOSIS — M79671 Pain in right foot: Secondary | ICD-10-CM | POA: Diagnosis not present

## 2019-11-01 DIAGNOSIS — L84 Corns and callosities: Secondary | ICD-10-CM | POA: Diagnosis not present

## 2019-11-01 DIAGNOSIS — B351 Tinea unguium: Secondary | ICD-10-CM | POA: Diagnosis not present

## 2019-11-01 DIAGNOSIS — M79672 Pain in left foot: Secondary | ICD-10-CM | POA: Diagnosis not present

## 2019-11-28 DIAGNOSIS — R69 Illness, unspecified: Secondary | ICD-10-CM | POA: Diagnosis not present

## 2019-11-29 DIAGNOSIS — L859 Epidermal thickening, unspecified: Secondary | ICD-10-CM | POA: Diagnosis not present

## 2019-11-29 DIAGNOSIS — L814 Other melanin hyperpigmentation: Secondary | ICD-10-CM | POA: Diagnosis not present

## 2019-11-29 DIAGNOSIS — D1801 Hemangioma of skin and subcutaneous tissue: Secondary | ICD-10-CM | POA: Diagnosis not present

## 2019-11-29 DIAGNOSIS — C44311 Basal cell carcinoma of skin of nose: Secondary | ICD-10-CM | POA: Diagnosis not present

## 2019-11-29 DIAGNOSIS — L820 Inflamed seborrheic keratosis: Secondary | ICD-10-CM | POA: Diagnosis not present

## 2019-11-29 DIAGNOSIS — L570 Actinic keratosis: Secondary | ICD-10-CM | POA: Diagnosis not present

## 2019-11-29 DIAGNOSIS — L821 Other seborrheic keratosis: Secondary | ICD-10-CM | POA: Diagnosis not present

## 2019-12-12 DIAGNOSIS — C44311 Basal cell carcinoma of skin of nose: Secondary | ICD-10-CM | POA: Diagnosis not present

## 2019-12-12 HISTORY — PX: NOSE SURGERY: SHX723

## 2019-12-26 ENCOUNTER — Telehealth: Payer: Self-pay

## 2019-12-26 NOTE — Telephone Encounter (Signed)
Discussed and confirmed apnt time for 10:45a with patient.

## 2019-12-26 NOTE — Telephone Encounter (Signed)
Called patient-no answer to verify what he has as his apnt time for Friday. Two patients scheduled for same time.

## 2019-12-27 ENCOUNTER — Other Ambulatory Visit: Payer: Self-pay

## 2019-12-27 DIAGNOSIS — R55 Syncope and collapse: Secondary | ICD-10-CM

## 2019-12-27 DIAGNOSIS — E7800 Pure hypercholesterolemia, unspecified: Secondary | ICD-10-CM | POA: Diagnosis not present

## 2019-12-28 LAB — COMPLETE METABOLIC PANEL WITH GFR
AG Ratio: 1.8 (calc) (ref 1.0–2.5)
ALT: 16 U/L (ref 9–46)
AST: 17 U/L (ref 10–35)
Albumin: 3.8 g/dL (ref 3.6–5.1)
Alkaline phosphatase (APISO): 92 U/L (ref 35–144)
BUN/Creatinine Ratio: 13 (calc) (ref 6–22)
BUN: 16 mg/dL (ref 7–25)
CO2: 30 mmol/L (ref 20–32)
Calcium: 8.9 mg/dL (ref 8.6–10.3)
Chloride: 107 mmol/L (ref 98–110)
Creat: 1.19 mg/dL — ABNORMAL HIGH (ref 0.70–1.18)
GFR, Est African American: 68 mL/min/{1.73_m2} (ref >=60)
GFR, Est Non African American: 59 mL/min/{1.73_m2} — ABNORMAL LOW (ref >=60)
Globulin: 2.1 g/dL (calc) (ref 1.9–3.7)
Glucose, Bld: 100 mg/dL — ABNORMAL HIGH (ref 65–99)
Potassium: 4.3 mmol/L (ref 3.5–5.3)
Sodium: 142 mmol/L (ref 135–146)
Total Bilirubin: 0.8 mg/dL (ref 0.2–1.2)
Total Protein: 5.9 g/dL — ABNORMAL LOW (ref 6.1–8.1)

## 2019-12-28 LAB — TSH: TSH: 1.79 mIU/L (ref 0.40–4.50)

## 2019-12-28 LAB — CBC WITH DIFFERENTIAL/PLATELET
Absolute Monocytes: 842 cells/uL (ref 200–950)
Basophils Absolute: 43 cells/uL (ref 0–200)
Basophils Relative: 0.6 %
Eosinophils Absolute: 43 cells/uL (ref 15–500)
Eosinophils Relative: 0.6 %
HCT: 42.2 % (ref 38.5–50.0)
Hemoglobin: 13.8 g/dL (ref 13.2–17.1)
Lymphs Abs: 1692 cells/uL (ref 850–3900)
MCH: 26.5 pg — ABNORMAL LOW (ref 27.0–33.0)
MCHC: 32.7 g/dL (ref 32.0–36.0)
MCV: 81 fL (ref 80.0–100.0)
MPV: 10.7 fL (ref 7.5–12.5)
Monocytes Relative: 11.7 %
Neutro Abs: 4579 cells/uL (ref 1500–7800)
Neutrophils Relative %: 63.6 %
Platelets: 172 10*3/uL (ref 140–400)
RBC: 5.21 10*6/uL (ref 4.20–5.80)
RDW: 14.5 % (ref 11.0–15.0)
Total Lymphocyte: 23.5 %
WBC: 7.2 10*3/uL (ref 3.8–10.8)

## 2019-12-28 LAB — HEMOGLOBIN A1C
Hgb A1c MFr Bld: 5.4 % of total Hgb (ref <5.7)
Mean Plasma Glucose: 108 (calc)
eAG (mmol/L): 6 (calc)

## 2019-12-28 LAB — LIPID PANEL
Cholesterol: 161 mg/dL (ref <200)
HDL: 54 mg/dL (ref >=40)
LDL Cholesterol (Calc): 87 mg/dL (calc)
Non-HDL Cholesterol (Calc): 107 mg/dL (calc) (ref <130)
Total CHOL/HDL Ratio: 3 (calc) (ref <5.0)
Triglycerides: 107 mg/dL (ref <150)

## 2019-12-30 ENCOUNTER — Encounter: Payer: Self-pay | Admitting: Internal Medicine

## 2019-12-30 ENCOUNTER — Other Ambulatory Visit: Payer: Self-pay

## 2019-12-30 ENCOUNTER — Non-Acute Institutional Stay: Payer: Medicare HMO | Admitting: Internal Medicine

## 2019-12-30 VITALS — BP 112/62 | HR 78 | Temp 97.7°F | Ht 66.0 in | Wt 188.8 lb

## 2019-12-30 DIAGNOSIS — I10 Essential (primary) hypertension: Secondary | ICD-10-CM

## 2019-12-30 DIAGNOSIS — E7800 Pure hypercholesterolemia, unspecified: Secondary | ICD-10-CM

## 2019-12-30 DIAGNOSIS — M545 Low back pain, unspecified: Secondary | ICD-10-CM

## 2019-12-30 DIAGNOSIS — G2581 Restless legs syndrome: Secondary | ICD-10-CM

## 2019-12-30 NOTE — Progress Notes (Signed)
Location:  [place] of Service:  Clinic (12)  Provider:   Code Status:  Goals of Care: No flowsheet data found.   Chief Complaint  Patient presents with  . Medical Management of Chronic Issues    6 month follow up  . Health Maintenance    TDAP    HPI: Patient is a 79 y.o. male seen today for medical management of chronic diseases.    Active complaints Restless leg His daughter told him to start taking high doses of magnesium He says that that has helped Low back pain S/p steroid shot MRI done on 9/20 showed multiple disc herniation and lumbar area Not having any more back pain no sciatic  H/o Vasovagal Syncope with H/o RBBB on EKG with No AV blocks on Zio Patch Episodes where he get Lightheaded weak and Dizzy and Passout Follows with Cardiology Dr Ola Spurr in [facility] He tool him off HCTZ. Loose control of BP Has not had any episodes recently Hypertension Loose control due to above.   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 Not to be repeated now unless symptoms No Urology follow up  Hyperlipidemia On Lipitor Recently moved to [place] with his wife. Very active No Cane or Walker. No Falls. Still drive Past Medical History:  Diagnosis Date  . Hypertension   . Prostate cancer [facility])     Past Surgical History:  Procedure Laterality Date  . NOSE SURGERY  12/12/2019  . PROSTATE SURGERY  2005   [name]    No Known Allergies  Outpatient Encounter Medications as of 12/30/2019  Medication Sig  . amLODipine (NORVASC) 5 MG tablet Take 5 mg by mouth daily.  Marland Kitchen atorvastatin (LIPITOR) 10 MG tablet Take 10 mg by mouth daily.  Marland Kitchen lisinopril (ZESTRIL) 20 MG tablet Take 20 mg by mouth daily.  Marland Kitchen MAGNESIUM GLYCINATE PO Take 1,200  mg by mouth daily.   No facility-administered encounter medications on file as of 12/30/2019.    Review of Systems:  Review of Systems Review of Systems  Constitutional: Negative for activity change, appetite change, chills, diaphoresis, fatigue and fever.  HENT: Negative for mouth sores, postnasal drip, rhinorrhea, sinus pain and sore throat.   Respiratory: Negative for apnea, cough, chest tightness, shortness of breath and wheezing.   Cardiovascular: Negative for chest pain, palpitations and leg swelling.  Gastrointestinal: Negative for abdominal distention, abdominal pain, constipation, diarrhea, nausea and vomiting.  Genitourinary: Negative for dysuria and frequency.  Musculoskeletal: Negative for arthralgias, joint swelling and myalgias.  Skin: Negative for rash.  Neurological: Negative for dizziness, syncope, weakness, light-headedness and numbness.  Psychiatric/Behavioral: Negative for behavioral problems, confusion and sleep disturbance.    Health Maintenance  Topic Date Due  . TETANUS/TDAP  08/19/2011  . INFLUENZA VACCINE  03/18/2020  . COVID-19 Vaccine  Completed  . PNA vac Low Risk Adult  Completed    Physical Exam: Vitals:   12/30/19 1051  BP: 112/62  Pulse: 78  Temp: 97.7 F (36.5 C)  SpO2: 95%  Weight: 188 lb 12.8 oz (85.6 kg)  Height: 5\' 6"  (1.676 m)   Body mass index is 30.47 kg/m. Physical Exam  Constitutional: Oriented to person, place, and time. Well-developed and well-nourished.  HENT:  Head: Normocephalic.  Ears No Wax Mouth/Throat:  Oropharynx is clear and moist.  Eyes: Pupils are equal, round, and reactive to light.  Neck: Neck supple.  Cardiovascular: Normal rate and normal heart sounds.  No murmur heard. Pulmonary/Chest: Effort normal and breath sounds normal. No respiratory distress. No wheezes. She has no rales.  Abdominal: Soft. Bowel sounds are normal. No distension. There is no tenderness. There is no rebound.  Musculoskeletal: No  edema.  Lymphadenopathy: none Neurological: Alert and oriented to person, place, and time.  Skin: Skin is warm and dry.  Psychiatric: Normal mood and affect. Behavior is normal. Thought content normal.    Labs reviewed: Basic Metabolic Panel: Recent Labs    12/27/19 0800  NA 142  K 4.3  CL 107  CO2 30  GLUCOSE 100*  BUN 16  CREATININE 1.19*  CALCIUM 8.9  TSH 1.79   Liver Function Tests: Recent Labs    12/27/19 0800  AST 17  ALT 16  BILITOT 0.8  PROT 5.9*   No results for input(s): LIPASE, AMYLASE in the last 8760 hours. No results for input(s): AMMONIA in the last 8760 hours. CBC: Recent Labs    12/27/19 0800  WBC 7.2  NEUTROABS 4,579  HGB 13.8  HCT 42.2  MCV 81.0  PLT 172   Lipid Panel: Recent Labs    12/27/19 0800  CHOL 161  HDL 54  LDLCALC 87  TRIG 107  CHOLHDL 3.0   Lab Results  Component Value Date   HGBA1C 5.4 12/27/2019    Procedures since last visit: No results found.  Assessment/Plan  Restless leg D/w about stretching. Improve Hydration If no Improvement Consider Neurontin or Requip  Essential hypertension Loose control on Norvasc and Lisinopril Hypercholesterolemia LDL 87 on Lipitor Low back pain without sciatica, unspecified back pain laterality, unspecified chronicity Doing well with no Symptoms     Labs/tests ordered:  * No order type specified * Next appt:  Visit date not found

## 2020-01-03 DIAGNOSIS — H2511 Age-related nuclear cataract, right eye: Secondary | ICD-10-CM | POA: Diagnosis not present

## 2020-01-03 DIAGNOSIS — H25041 Posterior subcapsular polar age-related cataract, right eye: Secondary | ICD-10-CM | POA: Diagnosis not present

## 2020-01-03 DIAGNOSIS — H25811 Combined forms of age-related cataract, right eye: Secondary | ICD-10-CM | POA: Diagnosis not present

## 2020-01-03 DIAGNOSIS — Z961 Presence of intraocular lens: Secondary | ICD-10-CM | POA: Diagnosis not present

## 2020-01-24 DIAGNOSIS — L820 Inflamed seborrheic keratosis: Secondary | ICD-10-CM | POA: Diagnosis not present

## 2020-01-24 DIAGNOSIS — L821 Other seborrheic keratosis: Secondary | ICD-10-CM | POA: Diagnosis not present

## 2020-01-24 DIAGNOSIS — C44311 Basal cell carcinoma of skin of nose: Secondary | ICD-10-CM | POA: Diagnosis not present

## 2020-01-24 DIAGNOSIS — L570 Actinic keratosis: Secondary | ICD-10-CM | POA: Diagnosis not present

## 2020-01-24 DIAGNOSIS — L859 Epidermal thickening, unspecified: Secondary | ICD-10-CM | POA: Diagnosis not present

## 2020-01-24 DIAGNOSIS — D1801 Hemangioma of skin and subcutaneous tissue: Secondary | ICD-10-CM | POA: Diagnosis not present

## 2020-01-24 DIAGNOSIS — L814 Other melanin hyperpigmentation: Secondary | ICD-10-CM | POA: Diagnosis not present

## 2020-02-12 DIAGNOSIS — G4733 Obstructive sleep apnea (adult) (pediatric): Secondary | ICD-10-CM | POA: Diagnosis not present

## 2020-02-13 DIAGNOSIS — G4733 Obstructive sleep apnea (adult) (pediatric): Secondary | ICD-10-CM | POA: Diagnosis not present

## 2020-02-14 DIAGNOSIS — B351 Tinea unguium: Secondary | ICD-10-CM | POA: Diagnosis not present

## 2020-02-14 DIAGNOSIS — L84 Corns and callosities: Secondary | ICD-10-CM | POA: Diagnosis not present

## 2020-02-14 DIAGNOSIS — M79671 Pain in right foot: Secondary | ICD-10-CM | POA: Diagnosis not present

## 2020-02-14 DIAGNOSIS — M79672 Pain in left foot: Secondary | ICD-10-CM | POA: Diagnosis not present

## 2020-03-29 DIAGNOSIS — M545 Low back pain: Secondary | ICD-10-CM | POA: Diagnosis not present

## 2020-03-29 DIAGNOSIS — M62830 Muscle spasm of back: Secondary | ICD-10-CM | POA: Diagnosis not present

## 2020-03-31 DIAGNOSIS — M545 Low back pain: Secondary | ICD-10-CM | POA: Diagnosis not present

## 2020-03-31 DIAGNOSIS — M62830 Muscle spasm of back: Secondary | ICD-10-CM | POA: Diagnosis not present

## 2020-04-02 DIAGNOSIS — M545 Low back pain: Secondary | ICD-10-CM | POA: Diagnosis not present

## 2020-04-02 DIAGNOSIS — M62830 Muscle spasm of back: Secondary | ICD-10-CM | POA: Diagnosis not present

## 2020-04-04 DIAGNOSIS — M545 Low back pain: Secondary | ICD-10-CM | POA: Diagnosis not present

## 2020-04-04 DIAGNOSIS — M62830 Muscle spasm of back: Secondary | ICD-10-CM | POA: Diagnosis not present

## 2020-04-06 DIAGNOSIS — M62830 Muscle spasm of back: Secondary | ICD-10-CM | POA: Diagnosis not present

## 2020-04-06 DIAGNOSIS — M545 Low back pain: Secondary | ICD-10-CM | POA: Diagnosis not present

## 2020-04-09 DIAGNOSIS — M62830 Muscle spasm of back: Secondary | ICD-10-CM | POA: Diagnosis not present

## 2020-04-09 DIAGNOSIS — M545 Low back pain: Secondary | ICD-10-CM | POA: Diagnosis not present

## 2020-04-11 DIAGNOSIS — M62830 Muscle spasm of back: Secondary | ICD-10-CM | POA: Diagnosis not present

## 2020-04-11 DIAGNOSIS — M545 Low back pain: Secondary | ICD-10-CM | POA: Diagnosis not present

## 2020-04-17 ENCOUNTER — Telehealth: Payer: Self-pay | Admitting: Internal Medicine

## 2020-04-17 NOTE — Telephone Encounter (Signed)
Pt called stating that his therapy at [facility] has not helped with sciatica pain. Would like to try dry needling with PT at [facility] location to see if that would help.  If you agree to let him do that, just put in referral & I will send that referral over tho the [place] location for scheduling.  Thanks,  Kathyrn Lass

## 2020-04-18 ENCOUNTER — Other Ambulatory Visit: Payer: Self-pay | Admitting: Internal Medicine

## 2020-04-18 DIAGNOSIS — M545 Low back pain, unspecified: Secondary | ICD-10-CM

## 2020-04-18 NOTE — Telephone Encounter (Signed)
I made the Referal

## 2020-04-18 NOTE — Telephone Encounter (Signed)
Thanks for dropping the referral for PT. It will need to state that [name] is interested in dry needling with your permission in order for them to treat.  Please add that note to the referral & let me when ready to send.  Thanks,  Vilinda Blanks.

## 2020-04-18 NOTE — Addendum Note (Signed)
Addended by: Georgina Snell on: 04/18/2020 04:21 PM   Modules accepted: Orders

## 2020-04-27 ENCOUNTER — Telehealth: Payer: Self-pay | Admitting: *Deleted

## 2020-04-27 NOTE — Telephone Encounter (Signed)
Patient called and wanted to know if you would prescribe him Flexeril for back spasms he has had for 3 months.  Stated that his massage Therapist recommended them and stated that it would also help him rest at night.  Patient wants to know if you would send him a Rx to [facility]. Please Advise.

## 2020-04-30 MED ORDER — METHOCARBAMOL 500 MG PO TABS
250.0000 mg | ORAL_TABLET | Freq: Two times a day (BID) | ORAL | 0 refills | Status: DC
Start: 2020-04-30 — End: 2020-06-21

## 2020-04-30 NOTE — Telephone Encounter (Signed)
Patient notified and agreed.  °Rx sent to pharmacy.  °

## 2020-04-30 NOTE — Telephone Encounter (Signed)
I do not Prescribe Flexeril as it is very sedating and Addicting. I can prescribe Robaxin 250 mg BID for 2 weeks. If he needs more then that he needs to come to my clinic and d/w me.

## 2020-04-30 NOTE — Addendum Note (Signed)
Addended by: Rafael Bihari A on: 04/30/2020 10:33 AM   Modules accepted: Orders

## 2020-04-30 NOTE — Telephone Encounter (Signed)
Please Advise

## 2020-05-08 DIAGNOSIS — R69 Illness, unspecified: Secondary | ICD-10-CM | POA: Diagnosis not present

## 2020-05-10 ENCOUNTER — Encounter: Payer: Self-pay | Admitting: *Deleted

## 2020-05-10 NOTE — Telephone Encounter (Signed)
error 

## 2020-05-10 NOTE — Telephone Encounter (Signed)
Patient called and stated that he has been taking the Robaxin with no Relief. Stated that it is not helping his Sciatica pain or tension in back.  Stated that he is going on vacation and leaving 05/15/20 on plane and needs something to give him relief.  Please Advise.

## 2020-05-10 NOTE — Telephone Encounter (Signed)
He needs to come and see some one in the office.

## 2020-05-10 NOTE — Telephone Encounter (Signed)
Patient notified and agreed. Scheduled an appointment here at the office for 05/11/2020 with Monina.

## 2020-05-11 ENCOUNTER — Encounter: Payer: Self-pay | Admitting: Adult Health

## 2020-05-11 ENCOUNTER — Other Ambulatory Visit: Payer: Self-pay

## 2020-05-11 ENCOUNTER — Ambulatory Visit (INDEPENDENT_AMBULATORY_CARE_PROVIDER_SITE_OTHER): Payer: Medicare HMO | Admitting: Adult Health

## 2020-05-11 VITALS — BP 150/80 | HR 72 | Temp 97.9°F | Ht 66.0 in | Wt 195.0 lb

## 2020-05-11 DIAGNOSIS — G8929 Other chronic pain: Secondary | ICD-10-CM | POA: Diagnosis not present

## 2020-05-11 DIAGNOSIS — M5441 Lumbago with sciatica, right side: Secondary | ICD-10-CM | POA: Diagnosis not present

## 2020-05-11 DIAGNOSIS — I10 Essential (primary) hypertension: Secondary | ICD-10-CM | POA: Diagnosis not present

## 2020-05-11 MED ORDER — CYCLOBENZAPRINE HCL 5 MG PO TABS: 5.0000 mg | ORAL_TABLET | Freq: Two times a day (BID) | ORAL | 0 refills | Status: DC | PRN

## 2020-05-11 NOTE — Patient Instructions (Signed)

## 2020-05-11 NOTE — Progress Notes (Signed)
[place] [facility]  Provider:   Durenda Age - DNP  Code Status:  Full Code  Goals of Care: No flowsheet data found.   Chief Complaint  Patient presents with  . Acute Visit    Complains of back pain and sciatica right buttocks.patient has had symptoms since June. The pain is leveling off. Tight muscles across back. Patient has been using muscle relaxer that was prescribed, but it doesn't seem to be helping.    HPI: Patient is a 79 y.o. male seen today for an acute visit for chronic low back pain. He has a PMH of hypertension, prostate cancer and hyperlipidemia. He stated that he has low back pain radiating to his right leg which occurs when he sits or lie down  for a long period of time , 5-6/10 pain level. He further stated that "the pain is not bad when standing up." He gets relief from the tension on his back when he sleeps on his stomach. He stated that when he was 79 years old, he twisted himself and started feeling low back pain then. A year ago, he had imaging done which showed herniated disc, L4 and L5. He was recently prescribed Robaxin 250 mg twice daily for 2 weeks.  He stated that it did not help his back pain and wanted to try Flexeril.  He is a scheduled to go out of town on the plane requiring him to sit for a long period of time next week.  Past Medical History:  Diagnosis Date  . Hypertension   . Prostate cancer [facility])     Past Surgical History:  Procedure Laterality Date  . NOSE SURGERY  12/12/2019  . PROSTATE SURGERY  2005   [name]    No Known Allergies  Outpatient Encounter Medications as of 05/11/2020  Medication Sig  . amLODipine (NORVASC) 5 MG tablet Take 5 mg by mouth daily.  Marland Kitchen atorvastatin (LIPITOR) 10 MG tablet Take 10 mg by mouth daily.  Marland Kitchen lisinopril (ZESTRIL) 20 MG tablet Take 20 mg by mouth daily.  Marland Kitchen MAGNESIUM GLYCINATE PO Take 1,200 mg by mouth daily.  . methocarbamol (ROBAXIN) 500 MG tablet Take 0.5 tablets (250 mg total) by mouth 2 (two)  times daily.   No facility-administered encounter medications on file as of 05/11/2020.    Review of Systems:  Review of Systems  Constitutional: Positive for activity change.       Less exercise due to back pain   HENT: Negative for congestion and postnasal drip.   Eyes: Negative for discharge.  Respiratory: Negative for cough and shortness of breath.   Cardiovascular: Negative for chest pain and leg swelling.  Gastrointestinal: Negative for abdominal distention, abdominal pain, constipation and vomiting.  Endocrine: Negative.   Genitourinary: Negative for difficulty urinating and hematuria.  Musculoskeletal: Positive for back pain.       Radiating to RLE  Skin: Negative for rash.  Neurological: Negative for tremors, weakness and numbness.  Psychiatric/Behavioral: Negative for agitation and behavioral problems.    Health Maintenance  Topic Date Due  . Hepatitis C Screening  Never done  . TETANUS/TDAP  08/19/2011  . INFLUENZA VACCINE  03/18/2020  . COVID-19 Vaccine  Completed  . PNA vac Low Risk Adult  Completed    Physical Exam: Vitals:   05/11/20 1339  BP: (!) 150/80  Pulse: 72  Temp: 97.9 F (36.6 C)  TempSrc: Temporal  SpO2: 97%  Weight: 195 lb (88.5 kg)  Height: 5\' 6"  (1.676 m)  Body mass index is 31.47 kg/m. Physical Exam Constitutional:      Appearance: He is obese.  HENT:     Head: Normocephalic and atraumatic.     Nose: Nose normal.     Mouth/Throat:     Mouth: Mucous membranes are moist.  Cardiovascular:     Rate and Rhythm: Normal rate and regular rhythm.     Pulses: Normal pulses.     Heart sounds: Normal heart sounds.  Pulmonary:     Effort: Pulmonary effort is normal.     Breath sounds: Normal breath sounds.  Abdominal:     General: Bowel sounds are normal.     Palpations: Abdomen is soft.  Musculoskeletal:        General: No swelling or tenderness. Normal range of motion.     Cervical back: Normal range of motion and neck supple.   Skin:    General: Skin is warm.  Neurological:     Mental Status: He is alert.  Psychiatric:        Mood and Affect: Mood normal.        Behavior: Behavior normal.        Thought Content: Thought content normal.     Labs reviewed: Basic Metabolic Panel: Recent Labs    12/27/19 0800  NA 142  K 4.3  CL 107  CO2 30  GLUCOSE 100*  BUN 16  CREATININE 1.19*  CALCIUM 8.9  TSH 1.79   Liver Function Tests: Recent Labs    12/27/19 0800  AST 17  ALT 16  BILITOT 0.8  PROT 5.9*   CBC: Recent Labs    12/27/19 0800  WBC 7.2  NEUTROABS 4,579  HGB 13.8  HCT 42.2  MCV 81.0  PLT 172   Lipid Panel: Recent Labs    12/27/19 0800  CHOL 161  HDL 54  LDLCALC 87  TRIG 107  CHOLHDL 3.0   Lab Results  Component Value Date   HGBA1C 5.4 12/27/2019     Assessment/Plan  1. Chronic midline low back pain with right-sided sciatica -  Will discontinue Robaxin and start Flexeril PRN - discussed side effects of Flexeril which can make him sedated, he was given printed information about Flexeril - Ambulatory referral to Spine Surgery - cyclobenzaprine (FLEXERIL) 5 MG tablet; Take 1 tablet (5 mg total) by mouth 2 (two) times daily as needed for up to 7 days for muscle spasms.  Dispense: 14 tablet; Refill: 0 X 1 week  2. Essential hypertension -  Continue Zestril 20 mg daily and Amlodipine 5 mg daily -  Monitor BPs    Labs/tests ordered: None  Next appt:  As needed

## 2020-05-13 MED ORDER — CYCLOBENZAPRINE HCL 5 MG PO TABS: 5.0000 mg | ORAL_TABLET | Freq: Two times a day (BID) | ORAL | 0 refills | Status: AC | PRN

## 2020-05-22 DIAGNOSIS — M5416 Radiculopathy, lumbar region: Secondary | ICD-10-CM | POA: Diagnosis not present

## 2020-05-22 DIAGNOSIS — I10 Essential (primary) hypertension: Secondary | ICD-10-CM | POA: Diagnosis not present

## 2020-05-22 DIAGNOSIS — Z6831 Body mass index (BMI) 31.0-31.9, adult: Secondary | ICD-10-CM | POA: Diagnosis not present

## 2020-06-04 ENCOUNTER — Ambulatory Visit: Payer: Medicare HMO | Attending: Internal Medicine

## 2020-06-04 ENCOUNTER — Other Ambulatory Visit: Payer: Self-pay

## 2020-06-04 DIAGNOSIS — M545 Low back pain, unspecified: Secondary | ICD-10-CM | POA: Diagnosis not present

## 2020-06-04 DIAGNOSIS — M5441 Lumbago with sciatica, right side: Secondary | ICD-10-CM | POA: Diagnosis not present

## 2020-06-04 DIAGNOSIS — G8929 Other chronic pain: Secondary | ICD-10-CM

## 2020-06-04 DIAGNOSIS — R252 Cramp and spasm: Secondary | ICD-10-CM | POA: Diagnosis not present

## 2020-06-04 DIAGNOSIS — M25651 Stiffness of right hip, not elsewhere classified: Secondary | ICD-10-CM | POA: Diagnosis not present

## 2020-06-04 NOTE — Patient Instructions (Addendum)
Trigger Point Dry Needling  . What is Trigger Point Dry Needling (DN)? o DN is a physical therapy technique used to treat muscle pain and dysfunction. Specifically, DN helps deactivate muscle trigger points (muscle knots).  o A thin filiform needle is used to penetrate the skin and stimulate the underlying trigger point. The goal is for a local twitch response (LTR) to occur and for the trigger point to relax. No medication of any kind is injected during the procedure.   . What Does Trigger Point Dry Needling Feel Like?  o The procedure feels different for each individual patient. Some patients report that they do not actually feel the needle enter the skin and overall the process is not painful. Very mild bleeding may occur. However, many patients feel a deep cramping in the muscle in which the needle was inserted. This is the local twitch response.   Marland Kitchen How Will I feel after the treatment? o Soreness is normal, and the onset of soreness may not occur for a few hours. Typically this soreness does not last longer than two days.  o Bruising is uncommon, however; ice can be used to decrease any possible bruising.  o In rare cases feeling tired or nauseous after the treatment is normal. In addition, your symptoms may get worse before they get better, this period will typically not last longer than 24 hours.   . What Can I do After My Treatment? o Increase your hydration by drinking more water for the next 24 hours. o You may place ice or heat on the areas treated that have become sore, however, do not use heat on inflamed or bruised areas. Heat often brings more relief post needling. o You can continue your regular activities, but vigorous activity is not recommended initially after the treatment for 24 hours. o DN is best combined with other physical therapy such as strengthening, stretching, and other therapies.   Access Code: [email or web address] URL: [email or web address] Date: 06/04/2020  Prepared by: Claiborne Billings  Exercises Static Prone on Elbows - 2 x daily - 7 x weekly - 1 sets - 1 reps - 1-3 minutes hold Standing Lumbar Extension - 3-5 x daily - 7 x weekly - 1 sets - 10 reps - 3 hold Prone Press Up - 2 x daily - 7 x weekly - 1 sets - 10 reps  [facility] [address] [place], [place] [address] Phone # [phone] Fax [phone]

## 2020-06-04 NOTE — Therapy (Signed)
[facility] [address], [place], [place], [address] Phone: [phone]   Fax:  [phone]  Physical Therapy Evaluation  Patient Details  Name: Patrick [name] MRN: [id number] Date of Birth: 23-Oct-1940 Referring Provider (PT): Veleta Miners, MD   Encounter Date: 06/04/2020   PT End of Session - 06/04/20 1700    Visit Number 1    Date for PT Re-Evaluation 07/30/20    Authorization Type [employer] Medicare    PT Start Time 1532    PT Stop Time 1619    PT Time Calculation (min) 47 min    Activity Tolerance Patient tolerated treatment well    Behavior During Therapy [facility] for tasks assessed/performed           Past Medical History:  Diagnosis Date  . Hypertension   . Prostate cancer [facility])     Past Surgical History:  Procedure Laterality Date  . NOSE SURGERY  12/12/2019  . PROSTATE SURGERY  2005   [name]    There were no vitals filed for this visit.    Subjective Assessment - 06/04/20 1541    Subjective Pt presents to PT with complaints of LBP and Rt LE pain of a chronic nature.  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  Another MRI is scheduled for 06/06/20.  Pt has been doing a few exercises given to him at PT at [facility] and is performing these regularly.  He feels like he is improving overall and has reached a plateau.    He is here now for dry needling to address the pain.    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 can be uncomfortable after long periods    How long can you stand comfortably? 10-15 minutes    Diagnostic tests MRI 1 year ago: HNP at L4/5.  Another scheduled 06/06/20    Patient Stated Goals reduce LBP and Rt gluteal/LE pain    Currently in Pain? Yes    Pain Score 1    up to 4/10   Pain Location Back    Pain Orientation Right;Left;Lower    Pain Descriptors / Indicators Aching;Shooting   leg  aching, back shooting   Pain Type Chronic pain    Pain Radiating Towards Rt buttock to the Rt posterior knee    Pain Onset More than a month ago    Pain Frequency Intermittent    Aggravating Factors  sitting too long, standing, rolling in bed- LBP, Rt LE pain is constant    Pain Relieving Factors change of position              [facility] PT Assessment - 06/04/20 0001      Assessment   Medical Diagnosis low back pain without sciatica    Referring Provider (PT) Veleta Miners, MD    Onset Date/Surgical Date 02/03/19   flare up 01/28/20   Prior Therapy at [facility]- issued exercises and he is doing them      Precautions   Precautions Other (comment)    Precaution Comments history of cancer      Restrictions   Weight Bearing Restrictions No      Balance Screen   Has the patient fallen in the past 6 months No    Has the patient had a decrease in activity level because of a fear of falling?  No  Is the patient reluctant to leave their home because of a fear of falling?  No      Home Environment   Living Environment Assisted living    Living Arrangements Spouse/significant other    Home Access Level entry      Prior Function   Level of Independence Independent    Vocation Retired    Leisure walking for exercise, [employer] training      Cognition   Overall Cognitive Status Within Functional Limits for tasks assessed      Observation/Other Assessments   Focus on Therapeutic Outcomes (FOTO)  45% limitation      Posture/Postural Control   Posture/Postural Control Postural limitations    Postural Limitations Decreased lumbar lordosis;Forward head      ROM / Strength   AROM / PROM / Strength AROM;PROM;Strength      AROM   Overall AROM  Deficits    Overall AROM Comments lumbar A/ROM limited by 25% with minimal lumbar segmetal mobility.  Increased Rt LE pain with forward flexion and this resolved immediately upon return to neutral      PROM   Overall PROM  Deficits     Overall PROM Comments bil hamstring and hip flexiblity limited by 25-50% without pain      Strength   Overall Strength Deficits    Overall Strength Comments Bil hips 4+/5, knees 5/5, core 4/5      Palpation   Spinal mobility significant reduction in PA mobility in the thoracic and lumbar spine- no pain    Palpation comment tension over Rt>Lt quadratus and paraspinals, trigger points in Rt proximal gluteals      Special Tests    Special Tests Lumbar    Lumbar Tests Slump Test;Straight Leg Raise      Slump test   Findings Positive    Side Right      Straight Leg Raise   Findings Negative    Side  Right      Transfers   Transfers Independent with all Transfers      Ambulation/Gait   Ambulation/Gait Yes    Gait Pattern Within Functional Limits                      Objective measurements completed on examination: See above findings.         Trigger Point Dry Needling - 06/04/20 0001    Consent Given? Yes    Education Handout Provided Yes    Muscles Treated Back/Hip Gluteus minimus;Gluteus medius;Gluteus maximus;Lumbar multifidi    Gluteus Minimus Response Twitch response elicited;Palpable increased muscle length   Rt gluteals only   Gluteus Medius Response Twitch response elicited;Palpable increased muscle length    Gluteus Maximus Response Twitch response elicited;Palpable increased muscle length    Lumbar multifidi Response Twitch response elicited;Palpable increased muscle length                PT Education - 06/04/20 1609    Education Details Access Code: [email or web address], DN info    Person(s) Educated Patient    Methods Explanation;Demonstration;Handout    Comprehension Verbalized understanding;Returned demonstration            PT Short Term Goals - 06/04/20 1704      PT SHORT TERM GOAL #1   Title be independent in initial HEP    Time 4    Period Weeks    Status New    Target Date 07/02/20      PT SHORT TERM GOAL #  2   Title report a 30%  reduction in Rt LE radiculopathy with daily tasks    Baseline constant    Time 4    Period Weeks    Status New    Target Date 07/02/20      PT SHORT TERM GOAL #3   Title reduce LBP and Rt LE pain to stand for 20 minutes without limitation    Time 4    Period Weeks    Status New    Target Date 07/02/20             PT Long Term Goals - 06/04/20 1548      PT LONG TERM GOAL #1   Title be independent in advanced HEP    Time 8    Period Weeks    Status New    Target Date 07/30/20      PT LONG TERM GOAL #2   Title reduce FOTO to < or = to 33% limitation    Time 8    Period Weeks    Status New    Target Date 07/30/20      PT LONG TERM GOAL #3   Title reduce LBP/Rt LE pain to stand for 30 minutes without limitation due to pain    Time 8    Period Weeks    Status New    Target Date 07/30/20      PT LONG TERM GOAL #4   Title report a 70% reduction in Rt LE radiculopathy with daily tasks    Baseline constant    Time 8    Period Weeks    Status New    Target Date 07/30/20      PT LONG TERM GOAL #5   Title verbalize and demonstrate body mechanics modifications to reduce strain on lumbar spine and reproduction of Rt LE symptoms    Time 8    Period Weeks    Status New    Target Date 07/30/20                  Plan - 06/04/20 1710    Clinical Impression Statement Pt presents to PT with chronic LBP and Rt LE radiculopathy that began 01/2019 with exacerbation of symptoms 01/2020.  Pt had an MRI 1 year ago that showed L4/5 HNP and had a series of injections at that time.  Pt will have another MRI this week.  PT reports bil lumbar pain and Rt LE radiculopathy.  LBP is variable and based on activity with exacerbation with standing or sitting too long.  Pt describes Rt gluteal pain that extends to the calf and is dull.  Pt is limited to 10-15 minutes of standing.  Pt is active with walking and weight training regularly.  Pt with reduced lumbar A/ROM with limited segmental  mobility.  Reproduction of Rt LE pain with flexion that resolves with return to neutral.  No change in LE symptoms with repeated extension.  Slump test is positive on the Rt and SLR is negative.  Pt had PT at [facility] and is doing exercises regularly and is being sent here to try dry needling.  Pt will benefit from skilled PT to address lumbar mobility and trigger points in lumbar spine and gluteals and advance exercises to reduce LBP and Rt LE radiculopathy.    Personal Factors and Comorbidities Age;Comorbidity 1    Comorbidities HNP    Examination-Activity Limitations Lift;Stand;Locomotion Level    Examination-Participation Restrictions Community Activity  Stability/Clinical Decision Making Stable/Uncomplicated    Clinical Decision Making Low    Rehab Potential Good    PT Frequency 1x / week    PT Duration 8 weeks    PT Treatment/Interventions ADLs/Self Care Home Management;Cryotherapy;Electrical Stimulation;Moist Heat;Neuromuscular re-education;Therapeutic exercise;Therapeutic activities;Functional mobility training;Gait training;Patient/family education;Manual techniques;Dry needling;Passive range of motion;Taping;Traction    PT Next Visit Plan assess response to dry needling and repeat if helpful, review HEP issued today and issued by [facility]    PT Home Exercise Plan Access Code: [email or web address]    Consulted and Agree with Plan of Care Patient           Patient will benefit from skilled therapeutic intervention in order to improve the following deficits and impairments:  Decreased activity tolerance, Postural dysfunction, Impaired flexibility, Pain, Increased muscle spasms, Decreased endurance, Decreased range of motion  Visit Diagnosis: Cramp and spasm - Plan: PT plan of care cert/re-cert  Stiffness of right hip, not elsewhere classified - Plan: PT plan of care cert/re-cert  Chronic bilateral low back pain with right-sided sciatica - Plan: PT plan of care  cert/re-cert     Problem List Patient Active Problem List   Diagnosis Date Noted  . Weight gain 07/08/2019  . ACP (advance care planning) 07/08/2019  . Foraminal stenosis of lumbar region 06/08/2019  . Lumbar spondylosis 06/08/2019  . Lumbar degenerative disc disease 06/08/2019  . Lumbar nerve root impingement 06/08/2019  . Chronic right-sided low back pain with sciatica 04/01/2019  . Syncope and collapse 10/12/2018  . Allergic rhinitis 09/18/2015  . Essential hypertension 09/18/2015  . Gastro-esophageal reflux disease with esophagitis 09/18/2015  . Hypercholesterolemia 09/18/2015  . Malignant neoplasm of prostate ([place]) 09/18/2015     Sigurd Sos, PT 06/04/20 5:16 PM  [facility] [address], [place], [place], [address] Phone: [phone]   Fax:  [phone]  Name: Patrick [name] MRN: [id number] Date of Birth: 1941-07-13

## 2020-06-06 DIAGNOSIS — M5116 Intervertebral disc disorders with radiculopathy, lumbar region: Secondary | ICD-10-CM | POA: Diagnosis not present

## 2020-06-06 DIAGNOSIS — M4726 Other spondylosis with radiculopathy, lumbar region: Secondary | ICD-10-CM | POA: Diagnosis not present

## 2020-06-06 DIAGNOSIS — M5416 Radiculopathy, lumbar region: Secondary | ICD-10-CM | POA: Diagnosis not present

## 2020-06-07 DIAGNOSIS — I10 Essential (primary) hypertension: Secondary | ICD-10-CM | POA: Diagnosis not present

## 2020-06-07 DIAGNOSIS — I4510 Unspecified right bundle-branch block: Secondary | ICD-10-CM | POA: Diagnosis not present

## 2020-06-07 DIAGNOSIS — R55 Syncope and collapse: Secondary | ICD-10-CM | POA: Diagnosis not present

## 2020-06-07 DIAGNOSIS — E7800 Pure hypercholesterolemia, unspecified: Secondary | ICD-10-CM | POA: Diagnosis not present

## 2020-06-13 ENCOUNTER — Other Ambulatory Visit: Payer: Self-pay

## 2020-06-13 ENCOUNTER — Ambulatory Visit: Payer: Medicare HMO

## 2020-06-13 DIAGNOSIS — M25651 Stiffness of right hip, not elsewhere classified: Secondary | ICD-10-CM

## 2020-06-13 DIAGNOSIS — M5441 Lumbago with sciatica, right side: Secondary | ICD-10-CM | POA: Diagnosis not present

## 2020-06-13 DIAGNOSIS — G8929 Other chronic pain: Secondary | ICD-10-CM | POA: Diagnosis not present

## 2020-06-13 DIAGNOSIS — M545 Low back pain, unspecified: Secondary | ICD-10-CM | POA: Diagnosis not present

## 2020-06-13 DIAGNOSIS — R252 Cramp and spasm: Secondary | ICD-10-CM

## 2020-06-13 NOTE — Therapy (Signed)
[facility] [address], [place], [place], [address] Phone: [phone]   Fax:  [phone]  Physical Therapy Treatment  Patient Details  Name: Patrick [name] MRN: [id number] Date of Birth: [birth year] Referring Provider (PT): Patrick Miners, MD   Encounter Date: 06/13/2020   PT End of Session - 06/13/20 1139    Visit Number 2    Date for PT Re-Evaluation 07/30/20    Authorization Type Aetna Medicare    PT Start Time 1102    PT Stop Time 1134    PT Time Calculation (min) 32 min    Activity Tolerance Patient tolerated treatment well    Behavior During Therapy [name] for tasks assessed/performed           Past Medical History:  Diagnosis Date  . Hypertension   . Prostate cancer [facility])     Past Surgical History:  Procedure Laterality Date  . NOSE SURGERY  12/12/2019  . PROSTATE SURGERY  2005   Patrick [name]    There were no vitals filed for this visit.   Subjective Assessment - 06/13/20 1101    Subjective I felt better than expected after last session.  No change in the leg pain.    Diagnostic tests MRI 1 year ago: HNP at L4/5.  Another scheduled 06/06/20    Currently in Pain? Yes    Pain Score 1    up to 3/10   Pain Location Back    Pain Orientation Right;Left;Lower    Pain Descriptors / Indicators Aching;Shooting    Pain Type Chronic pain    Pain Radiating Towards Rt buttock to the Rt posterior knee    Pain Onset More than a month ago    Pain Frequency Intermittent    Aggravating Factors  sitting too long, standing, rolling in bed    Pain Relieving Factors change of position                             [facility] Adult PT Treatment/Exercise - 06/13/20 0001      Exercises   Exercises Lumbar      Lumbar Exercises: Standing   Other Standing Lumbar Exercises extension x 10      Lumbar Exercises: Prone   Other Prone Lumbar Exercises prone on elbows and prone press up x 10       Manual Therapy   Manual Therapy Myofascial release;Soft tissue mobilization    Manual therapy comments elongation and release to bil lumbar and Rt gluteals            Trigger Point Dry Needling - 06/13/20 0001    Consent Given? Yes    Education Handout Provided Previously provided    Muscles Treated Back/Hip Gluteus minimus;Gluteus medius;Gluteus maximus;Lumbar multifidi    Gluteus Minimus Response Twitch response elicited;Palpable increased muscle length   Rt gluteals only   Gluteus Medius Response Twitch response elicited;Palpable increased muscle length    Gluteus Maximus Response Twitch response elicited;Palpable increased muscle length    Lumbar multifidi Response Twitch response elicited;Palpable increased muscle length                  PT Short Term Goals - 06/04/20 1704      PT SHORT TERM GOAL #1   Title be independent in initial HEP    Time 4    Period Weeks    Status New    Target Date 07/02/20  PT SHORT TERM GOAL #2   Title report a 30% reduction in Rt LE radiculopathy with daily tasks    Baseline constant    Time 4    Period Weeks    Status New    Target Date 07/02/20      PT SHORT TERM GOAL #3   Title reduce LBP and Rt LE pain to stand for 20 minutes without limitation    Time 4    Period Weeks    Status New    Target Date 07/02/20             PT Long Term Goals - 06/04/20 1548      PT LONG TERM GOAL #1   Title be independent in advanced HEP    Time 8    Period Weeks    Status New    Target Date 07/30/20      PT LONG TERM GOAL #2   Title reduce FOTO to < or = to 33% limitation    Time 8    Period Weeks    Status New    Target Date 07/30/20      PT LONG TERM GOAL #3   Title reduce LBP/Rt LE pain to stand for 30 minutes without limitation due to pain    Time 8    Period Weeks    Status New    Target Date 07/30/20      PT LONG TERM GOAL #4   Title report a 70% reduction in Rt LE radiculopathy with daily tasks    Baseline  constant    Time 8    Period Weeks    Status New    Target Date 07/30/20      PT LONG TERM GOAL #5   Title verbalize and demonstrate body mechanics modifications to reduce strain on lumbar spine and reproduction of Rt LE symptoms    Time 8    Period Weeks    Status New    Target Date 07/30/20                 Plan - 06/13/20 1137    Clinical Impression Statement Pt with first time follow-up after evaluation and initial dry needling to the lumbar spine and Rt gluteals performed on first session.  Pt denies any change in Rt LE symptoms after initial session.  Pt is independent and compliant in [place]enzie extension exercises in addition to exercises issued by therapist at [facility].  Pt with trigger points and tension in Rt>Lt lumbar paraspinals and Rt gluteals and demonstrated improved tissue mobility after manual therapy today.  Pt had MRI yesterday and has not yet received the results.  Pt will continue to benefit from skilled PT to address Rt LE pain.    PT Frequency 1x / week    PT Duration 8 weeks    PT Treatment/Interventions ADLs/Self Care Home Management;Cryotherapy;Electrical Stimulation;Moist Heat;Neuromuscular re-education;Therapeutic exercise;Therapeutic activities;Functional mobility training;Gait training;Patient/family education;Manual techniques;Dry needling;Passive range of motion;Taping;Traction    PT Next Visit Plan assess response to dry needling and repeat if helpful, review HEP issued by [facility].  See what results were from MRI this week.    PT Home Exercise Plan Access Code: [email or web address]    Consulted and Agree with Plan of Care Patient           Patient will benefit from skilled therapeutic intervention in order to improve the following deficits and impairments:  Decreased activity tolerance, Postural dysfunction, Impaired flexibility, Pain, Increased muscle  spasms, Decreased endurance, Decreased range of motion  Visit Diagnosis: Cramp and  spasm  Stiffness of right hip, not elsewhere classified  Chronic bilateral low back pain with right-sided sciatica     Problem List Patient Active Problem List   Diagnosis Date Noted  . Weight gain 07/08/2019  . ACP (advance care planning) 07/08/2019  . Foraminal stenosis of lumbar region 06/08/2019  . Lumbar spondylosis 06/08/2019  . Lumbar degenerative disc disease 06/08/2019  . Lumbar nerve root impingement 06/08/2019  . Chronic right-sided low back pain with sciatica 04/01/2019  . Syncope and collapse 10/12/2018  . Allergic rhinitis 09/18/2015  . Essential hypertension 09/18/2015  . Gastro-esophageal reflux disease with esophagitis 09/18/2015  . Hypercholesterolemia 09/18/2015  . Malignant neoplasm of prostate ([place]) 09/18/2015     Sigurd Sos, PT 06/13/20 11:41 AM  [facility] [address] [place], [place], [address] Phone: [phone]   Fax:  [phone]  Name: Patrick [name] MRN: [id number] Date of Birth: 1941/08/11

## 2020-06-18 ENCOUNTER — Other Ambulatory Visit: Payer: Self-pay | Admitting: *Deleted

## 2020-06-18 MED ORDER — AMLODIPINE BESYLATE 5 MG PO TABS: 5.0000 mg | ORAL_TABLET | Freq: Every day | ORAL | 1 refills | Status: DC

## 2020-06-18 MED ORDER — LISINOPRIL 20 MG PO TABS: 20.0000 mg | ORAL_TABLET | Freq: Every day | ORAL | 1 refills | Status: DC

## 2020-06-18 MED ORDER — ATORVASTATIN CALCIUM 10 MG PO TABS
10.0000 mg | ORAL_TABLET | Freq: Every day | ORAL | 1 refills | Status: DC
Start: 2020-06-18 — End: 2021-02-21

## 2020-06-18 NOTE — Telephone Encounter (Signed)
Patient requested refill

## 2020-06-21 ENCOUNTER — Telehealth: Payer: Self-pay

## 2020-06-21 ENCOUNTER — Other Ambulatory Visit: Payer: Self-pay

## 2020-06-21 ENCOUNTER — Ambulatory Visit (INDEPENDENT_AMBULATORY_CARE_PROVIDER_SITE_OTHER): Payer: Medicare HMO | Admitting: Nurse Practitioner

## 2020-06-21 ENCOUNTER — Encounter: Payer: Self-pay | Admitting: Nurse Practitioner

## 2020-06-21 VITALS — BP 134/80 | HR 68 | Temp 97.5°F | Ht 66.0 in | Wt 196.0 lb

## 2020-06-21 DIAGNOSIS — I10 Essential (primary) hypertension: Secondary | ICD-10-CM | POA: Diagnosis not present

## 2020-06-21 DIAGNOSIS — Z0000 Encounter for general adult medical examination without abnormal findings: Secondary | ICD-10-CM | POA: Diagnosis not present

## 2020-06-21 NOTE — Progress Notes (Signed)
Subjective:   Patrick [name] is a 80 y.o. male who presents for Medicare Annual/Subsequent preventive examination in clinic at [facility].  Cardiac Risk Factors include: advanced age (>36men, >25 women);hypertension;male gender;obesity (BMI >30kg/m2)     Objective:    Today's Vitals   06/21/20 1312 06/21/20 1315  BP: 134/80   Pulse: 68   Temp: (!) 97.5 F (36.4 C)   SpO2: 97%   Weight: 196 lb (88.9 kg)   Height: 5\' 6"  (1.676 m)   PainSc:  3    Body mass index is 31.64 kg/m.  Advanced Directives 06/04/2020  Does Patient Have a Medical Advance Directive? Yes  Type of Advance Directive Living will;Healthcare Power of Attorney  Does patient want to make changes to medical advance directive? No - Patient declined    Current Medications (verified) Outpatient Encounter Medications as of 06/21/2020  Medication Sig  . amLODipine (NORVASC) 5 MG tablet Take 1 tablet (5 mg total) by mouth daily.  Marland Kitchen atorvastatin (LIPITOR) 10 MG tablet Take 1 tablet (10 mg total) by mouth daily.  . Cholecalciferol (VITAMIN D3 PO) Take by mouth daily.  Marland Kitchen lisinopril (ZESTRIL) 20 MG tablet Take 1 tablet (20 mg total) by mouth daily.  Marland Kitchen MAGNESIUM GLYCINATE PO Take 1,200 mg by mouth daily.  . [DISCONTINUED] methocarbamol (ROBAXIN) 500 MG tablet Take 0.5 tablets (250 mg total) by mouth 2 (two) times daily.   No facility-administered encounter medications on file as of 06/21/2020.    Allergies (verified) Patient has no known allergies.   History: Past Medical History:  Diagnosis Date  . Hypertension   . Prostate cancer [facility])    Past Surgical History:  Procedure Laterality Date  . NOSE SURGERY  12/12/2019  . PROSTATE SURGERY  2005   [name]   Family History  Problem Relation Age of Onset  . Congestive Heart Failure Mother    Social History   Socioeconomic History  . Marital status: Married    Spouse name: Not on file  . Number of children: Not on file  . Years of  education: Not on file  . Highest education level: Not on file  Occupational History  . Not on file  Tobacco Use  . Smoking status: Never Smoker  . Smokeless tobacco: Never Used  Vaping Use  . Vaping Use: Never used  Substance and Sexual Activity  . Alcohol use: Never  . Drug use: Not on file  . Sexual activity: Not on file  Other Topics Concern  . Not on file  Social History Narrative   Social History      Diet? n/a      Do you drink/eat things with caffeine? yes      Marital status?  Married                                  What year were you married? 1965      Do you live in a house, apartment, assisted living, condo, trailer, etc.? [place]       Is it one or more stories? 4      How many persons live in your home? 2      Do you have any pets in your home? (please list) no      Highest level of education completed? Masters      Current or past profession: clergy  Do you exercise?                   yes                   Type & how often? Walk weights      Advanced Directives      Do you have a living will?yes       Do you have a DNR form?                                  If not, do you want to discuss one? yes      Do you have signed POA/HPOA for forms? yes      Functional Status      Do you have difficulty bathing or dressing yourself? no      Do you have difficulty preparing food or eating? no      Do you have difficulty managing your medications?no      Do you have difficulty managing your finances?no      Do you have difficulty affording your medications?no      Social Determinants of Health   Financial Resource Strain:   . Difficulty of Paying Living Expenses: Not on file  Food Insecurity:   . Worried About Charity fundraiser in the Last Year: Not on file  . Ran Out of Food in the Last Year: Not on file  Transportation Needs:   . Lack of Transportation (Medical): Not on file  . Lack of Transportation (Non-Medical): Not  on file  Physical Activity:   . Days of Exercise per Week: Not on file  . Minutes of Exercise per Session: Not on file  Stress:   . Feeling of Stress : Not on file  Social Connections:   . Frequency of Communication with Friends and Family: Not on file  . Frequency of Social Gatherings with Friends and Family: Not on file  . Attends Religious Services: Not on file  . Active Member of Clubs or Organizations: Not on file  . Attends Archivist Meetings: Not on file  . Marital Status: Not on file    Tobacco Counseling Counseling given: Not Answered   Clinical Intake:  Pre-visit preparation completed: Yes  Pain : 0-10 Pain Score: 3  Pain Type: Chronic pain Pain Location: Back (lower back radiating to the right buttock) Pain Orientation: Right Pain Radiating Towards: right buttock Pain Descriptors / Indicators: Aching, Constant Pain Onset:  (5 months) Pain Relieving Factors: acupuncture, physical therapy, failed muslerelexant. Effect of Pain on Daily Activities: slows down a little especially in morning wiht dressing  Pain Relieving Factors: acupuncture, physical therapy, failed muslerelexant.  BMI - recorded: 31.64 Nutritional Status: BMI > 30  Obese Nutritional Risks: None Diabetes: No  How often do you need to have someone help you when you read instructions, pamphlets, or other written materials from your doctor or pharmacy?: 1 - Never What is the last grade level you completed in school?: graduate degree  Diabetic? no  Interpreter Needed?: No  Information entered by :: [name]   Activities of Daily Living In your present state of health, do you have any difficulty performing the following activities: 06/21/2020  Hearing? N  Vision? N  Comment s/p cataract sugeries R+L  Difficulty concentrating or making decisions? N  Walking or climbing stairs? N  Dressing or bathing? N  Comment sometime with putting on sock of the right foot.  Doing  errands, shopping? N  Preparing Food and eating ? N  Using the Toilet? N  In the past six months, have you accidently leaked urine? N  Comment s/p prostate surgery  Do you have problems with loss of bowel control? N  Managing your Medications? N  Managing your Finances? N  Housekeeping or managing your Housekeeping? N  Some recent data might be hidden    Patient Care Team: Virgie Dad, MD as PCP - General (Internal Medicine)  Indicate any recent Medical Services you may have received from other than [facility] providers in the past year (date may be approximate).     Assessment:   This is a routine wellness examination for Patrick [name].  Hearing/Vision screen No exam data present  Dietary issues and exercise activities discussed: Current Exercise Habits: Home exercise routine, Type of exercise: strength training/weights;walking, Time (Minutes): 45, Frequency (Times/Week): 5, Weekly Exercise (Minutes/Week): 225, Intensity: Mild, Exercise limited by: neurologic condition(s);cardiac condition(s)  Goals    . Develop a Weight Loss Readiness Plan     Follow Up Date a year   Plan of to loss about #15Ibs in a year   Why is this important?   Losing weight requires time to prepare your mind and your home.  Identify your eating habits and the emotions attached to eating.  Think about ways to be successful.  When you are not ready, you could lose motivation and give up on your plan.     Notes: portion control, exercise, more active such as walking.       Depression Screen PHQ 2/9 Scores 06/21/2020  PHQ - 2 Score 0  PHQ- 9 Score 1    Fall Risk Fall Risk  12/30/2019 07/08/2019  Falls in the past year? 0 0  Number falls in past yr: 0 0    Any stairs in or around the home? Yes  If so, are there any without handrails? Yes  Home free of loose throw rugs in walkways, pet beds, electrical cords, etc? Yes  Adequate lighting in your home to reduce risk of falls? Yes   ASSISTIVE DEVICES  UTILIZED TO PREVENT FALLS:  Life alert? Yes  Use of a cane, walker or w/c? No  Grab bars in the bathroom? yes Shower chair or bench in shower? Yes  Elevated toilet seat or a handicapped toilet? No   TIMED UP AND GO:  Was the test performed? No .  Length of time to ambulate 10 feet: 0 sec.   Gait steady and fast without use of assistive device  Cognitive Function: MMSE - Mini Mental State Exam 06/21/2020  Orientation to time 5  Orientation to Place 5  Registration 3  Attention/ Calculation 1  Recall 3  Language- name 2 objects 2  Language- repeat 1  Language- follow 3 step command 3  Language- read & follow direction 1  Write a sentence 1  Copy design 1  Total score 26        Immunizations Immunization History  Administered Date(s) Administered  . Hepatitis A 06/26/2000  . Influenza, High Dose Seasonal PF 05/27/2016, 05/08/2020  . Influenza-Unspecified 08/09/2008  . Moderna SARS-COVID-2 Vaccination 08/22/2019, 09/19/2019  . Pneumococcal Conjugate-13 11/23/2015  . Pneumococcal Polysaccharide-23 04/03/2017  . Tdap 08/18/2001  . Zoster 08/18/2004  . Zoster Recombinat (Shingrix) 11/03/2017, 01/18/2018    TDAP status: Due, Education has been provided regarding the importance of this vaccine. Advised may receive  this vaccine at local pharmacy or Health Dept. Aware to provide a copy of the vaccination record if obtained from local pharmacy or Health Dept. Verbalized acceptance and understanding. up to date Pneumococcal vaccine status: Up to date Covid-19 vaccine status: Completed vaccines  Qualifies for Shingles Vaccine? Yes   Zostavax completed Yes   Shingrix Completed?: Yes  Screening Tests Health Maintenance  Topic Date Due  . Hepatitis C Screening  Never done  . TETANUS/TDAP  08/19/2011  . INFLUENZA VACCINE  Completed  . COVID-19 Vaccine  Completed  . PNA vac Low Risk Adult  Completed    Health Maintenance  Health Maintenance Due  Topic Date Due  .  Hepatitis C Screening  Never done  . TETANUS/TDAP  08/19/2011    Colorectal cancer screening: No longer required.   Lung Cancer Screening: (Low Dose CT Chest recommended if Age 26-80 years, 30 pack-year currently smoking OR have quit w/in 15years.) does not qualify.   Lung Cancer Screening Referral: none  Additional Screening:  Hepatitis C Screening: does not qualify; Completed no  Vision Screening: Recommended annual ophthalmology exams for early detection of glaucoma and other disorders of the eye. Is the patient up to date with their annual eye exam?  Yes  Who is the provider or what is the name of the office in which the patient attends annual eye exams? Dr. [facility] in [place] If pt is not established with a provider, would they like to be referred to a provider to establish care? No .   Dental Screening: Recommended annual dental exams for proper oral hygiene  Community Resource Referral / Chronic Care Management: CRR required this visit?  yes  CCM required this visit? yes     Plan:     I have personally reviewed and noted the following in the patient's chart:   . Medical and social history . Use of alcohol, tobacco or illicit drugs  . Current medications and supplements . Functional ability and status . Nutritional status . Physical activity . Advanced directives . List of other physicians . Hospitalizations, surgeries, and ER visits in previous 12 months . Vitals . Screenings to include cognitive, depression, and falls . Referrals and appointments  In addition, I have reviewed and discussed with patient certain preventive protocols, quality metrics, and best practice recommendations. A written personalized care plan for preventive services as well as general preventive health recommendations were provided to patient.     Patrick [name] X Regis Wiland, NP   06/21/2020   Nurse Notes: none

## 2020-06-21 NOTE — Telephone Encounter (Signed)
Called patient, msg left of voicemail also with future apnt days/times. AVS mailed.

## 2020-06-21 NOTE — Telephone Encounter (Signed)
-----   Message from Logan Bores, [place] sent at 06/21/2020  2:04 PM EDT ----- Ashok Cordia,  [employer] called me, asked that I speak with you, and have you place a call to this patient that you all seen today to reassure him that all his prescribed medications are under our providers name. You can view this information by looking under chart review - medications tab- and you will see RX's were filled on 06/18/2020 and are under Dr.Gupta's name.  Let me know if you have any questions  Thanks, CB

## 2020-06-26 ENCOUNTER — Encounter: Payer: Self-pay | Admitting: Internal Medicine

## 2020-06-27 ENCOUNTER — Encounter: Payer: Self-pay | Admitting: Physical Therapy

## 2020-06-27 ENCOUNTER — Ambulatory Visit: Payer: Medicare HMO | Attending: Internal Medicine | Admitting: Physical Therapy

## 2020-06-27 ENCOUNTER — Other Ambulatory Visit: Payer: Self-pay

## 2020-06-27 DIAGNOSIS — G8929 Other chronic pain: Secondary | ICD-10-CM

## 2020-06-27 DIAGNOSIS — R252 Cramp and spasm: Secondary | ICD-10-CM

## 2020-06-27 DIAGNOSIS — M25651 Stiffness of right hip, not elsewhere classified: Secondary | ICD-10-CM | POA: Diagnosis not present

## 2020-06-27 DIAGNOSIS — M5441 Lumbago with sciatica, right side: Secondary | ICD-10-CM | POA: Diagnosis not present

## 2020-06-27 NOTE — Therapy (Addendum)
[facility] [address] [place], [place], [address] Phone: [phone]   Fax:  [phone]  Physical Therapy Treatment and Discharge Summary  Patient Details  Name: Patrick [name] MRN: [id number] Date of Birth: 1941-03-12 Referring Provider (PT): Veleta Miners, MD   Encounter Date: 06/27/2020   PT End of Session - 06/27/20 0848    Visit Number 3    Date for PT Re-Evaluation 07/30/20    Authorization Type [employer]na Medicare    PT Start Time 0848    PT Stop Time 0928    PT Time Calculation (min) 40 min    Activity Tolerance Patient tolerated treatment well    Behavior During Therapy [facility] for tasks assessed/performed           Past Medical History:  Diagnosis Date  . Hypertension   . Prostate cancer [facility])     Past Surgical History:  Procedure Laterality Date  . NOSE SURGERY  12/12/2019  . PROSTATE SURGERY  2005   [name]    There were no vitals filed for this visit.   Subjective Assessment - 06/27/20 0850    Subjective Patient does not have results of MRI yet. No relief from DN. I will try one more time today and if it doesn't work then I will stop coming and continue exercising at home.    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 can be uncomfortable after long periods    How long can you stand comfortably? 10-15 minutes    Diagnostic tests MRI 1 year ago: HNP at L4/5.  Another scheduled 06/06/20    Patient Stated Goals reduce LBP and Rt gluteal/LE pain    Currently in Pain? Yes    Pain Score 2     Pain Location Back    Pain Orientation Right    Pain Descriptors / Indicators Aching;Constant                             [facility] Adult PT Treatment/Exercise - 06/27/20 0001      Lumbar Exercises: Stretches   Prone on Elbows Stretch 30 seconds    Press Ups 10 reps    Press Ups Limitations 3 sets       Lumbar Exercises: Aerobic    Nustep L2 x 5 min while discussing status       Lumbar Exercises: Sidelying   Other Sidelying Lumbar Exercises positioning over pillow in Lt SDLY with right upper trunk rotation: no change in sx      Lumbar Exercises: Quadruped   Straight Leg Raises Limitations 3 reps ea 3 sec hold with TCs for level pelvis    Opposite Arm/Leg Raise Right arm/Left leg;Left arm/Right leg    Opposite Arm/Leg Raise Limitations 3 reps ea 3 sec hold with TCs for level pelvis      Manual Therapy   Manual Therapy Soft tissue mobilization;Joint mobilization    Manual therapy comments skilled palpation and monitoring of soft tissues during DN    Joint Mobilization PA mobs L4/5, L5/S1 right    Soft tissue mobilization to right lumbar, gluteals and lateral gastroc            Trigger Point Dry Needling - 06/27/20 0001    Consent Given? Yes    Education Handout Provided Previously provided    Muscles Treated Lower Quadrant Gastrocnemius   right lateral  Muscles Treated Back/Hip Gluteus maximus;Gluteus medius;Lumbar multifidi    Gastrocnemius Response Twitch response elicited;Palpable increased muscle length    Gluteus Minimus Response Twitch response elicited;Palpable increased muscle length    Gluteus Medius Response Twitch response elicited;Palpable increased muscle length                PT Education - 06/27/20 1623    Education Details advised pt to increase frequency of press ups and to hold quadriped alt arm/leg longer    Person(s) Educated Patient    Methods Explanation;Demonstration;Tactile cues;Verbal cues    Comprehension Verbalized understanding;Returned demonstration            PT Short Term Goals - 06/04/20 1704      PT SHORT TERM GOAL #1   Title be independent in initial HEP    Time 4    Period Weeks    Status New    Target Date 07/02/20      PT SHORT TERM GOAL #2   Title report a 30% reduction in Rt LE radiculopathy with daily tasks    Baseline constant    Time 4     Period Weeks    Status New    Target Date 07/02/20      PT SHORT TERM GOAL #3   Title reduce LBP and Rt LE pain to stand for 20 minutes without limitation    Time 4    Period Weeks    Status New    Target Date 07/02/20             PT Long Term Goals - 06/04/20 1548      PT LONG TERM GOAL #1   Title be independent in advanced HEP    Time 8    Period Weeks    Status New    Target Date 07/30/20      PT LONG TERM GOAL #2   Title reduce FOTO to < or = to 33% limitation    Time 8    Period Weeks    Status New    Target Date 07/30/20      PT LONG TERM GOAL #3   Title reduce LBP/Rt LE pain to stand for 30 minutes without limitation due to pain    Time 8    Period Weeks    Status New    Target Date 07/30/20      PT LONG TERM GOAL #4   Title report a 70% reduction in Rt LE radiculopathy with daily tasks    Baseline constant    Time 8    Period Weeks    Status New    Target Date 07/30/20      PT LONG TERM GOAL #5   Title verbalize and demonstrate body mechanics modifications to reduce strain on lumbar spine and reproduction of Rt LE symptoms    Time 8    Period Weeks    Status New    Target Date 07/30/20                 Plan - 06/27/20 1624    Clinical Impression Statement Patient reporting no change in symptoms overall and stating he wants to try DN one more time. This was done to his lumbar multifidi, right gluteals and lateral gastroc with good response. Patient able to centralize sx with press ups and was advised to increased frequency and reps daily. No results from MRI yet.    Personal Factors and Comorbidities Age;Comorbidity 1  Comorbidities HNP    Examination-Activity Limitations Lift;Stand;Locomotion Level    PT Frequency 1x / week    PT Duration 8 weeks    PT Treatment/Interventions ADLs/Self Care Home Management;Cryotherapy;Electrical Stimulation;Moist Heat;Neuromuscular re-education;Therapeutic exercise;Therapeutic activities;Functional  mobility training;Gait training;Patient/family education;Manual techniques;Dry needling;Passive range of motion;Taping;Traction    PT Next Visit Plan assess response to dry needling and repeat if helpful, review HEP issued by [facility].  assess response to increased press ups. See what results were from MRI this week.    PT Home Exercise Plan Access Code: [email or web address]    Consulted and Agree with Plan of Care Patient           Patient will benefit from skilled therapeutic intervention in order to improve the following deficits and impairments:  Decreased activity tolerance, Postural dysfunction, Impaired flexibility, Pain, Increased muscle spasms, Decreased endurance, Decreased range of motion  Visit Diagnosis: Cramp and spasm  Stiffness of right hip, not elsewhere classified  Chronic bilateral low back pain with right-sided sciatica     Problem List Patient Active Problem List   Diagnosis Date Noted  . Weight gain 07/08/2019  . ACP (advance care planning) 07/08/2019  . Foraminal stenosis of lumbar region 06/08/2019  . Lumbar spondylosis 06/08/2019  . Lumbar degenerative disc disease 06/08/2019  . Lumbar nerve root impingement 06/08/2019  . Chronic right-sided low back pain with sciatica 04/01/2019  . Syncope and collapse 10/12/2018  . Allergic rhinitis 09/18/2015  . Essential hypertension 09/18/2015  . Gastro-esophageal reflux disease with esophagitis 09/18/2015  . Hypercholesterolemia 09/18/2015  . Malignant neoplasm of prostate ([place]) 09/18/2015   Madelyn Flavors PT  06/27/2020, 4:28 PM  [facility] [address] [place], [place], [address] Phone: [phone]   Fax:  [phone]  Name: Patrick [name] MRN: [id number] Date of Birth: Mar 25, 1941  PHYSICAL THERAPY DISCHARGE SUMMARY  Visits from Start of Care: 3  Current functional level related to goals / functional outcomes: Patient came into office and  stated that he was feeling better and wanted to be discharged.   Remaining deficits: unknown   Education / Equipment: HEP  Plan: Patient agrees to discharge.  Patient goals were not met. Patient is being discharged due to being pleased with the current functional level.  ?????    Madelyn Flavors, PT 07/05/20 5:43 PM [facility] [address] [place], [place] [address] Phone # [phone] Fax [phone]

## 2020-07-02 ENCOUNTER — Telehealth: Payer: Self-pay

## 2020-07-02 NOTE — Telephone Encounter (Signed)
Patient received a neurosurgery referral to [facility]. He states he would like a referral to a practice that has MyChart, as he had issues with getting results back from this office.

## 2020-07-05 DIAGNOSIS — M79671 Pain in right foot: Secondary | ICD-10-CM | POA: Diagnosis not present

## 2020-07-05 DIAGNOSIS — B351 Tinea unguium: Secondary | ICD-10-CM | POA: Diagnosis not present

## 2020-07-05 DIAGNOSIS — L84 Corns and callosities: Secondary | ICD-10-CM | POA: Diagnosis not present

## 2020-07-05 DIAGNOSIS — M79672 Pain in left foot: Secondary | ICD-10-CM | POA: Diagnosis not present

## 2020-07-06 DIAGNOSIS — I10 Essential (primary) hypertension: Secondary | ICD-10-CM | POA: Diagnosis not present

## 2020-07-06 DIAGNOSIS — Z6831 Body mass index (BMI) 31.0-31.9, adult: Secondary | ICD-10-CM | POA: Diagnosis not present

## 2020-07-06 DIAGNOSIS — M5416 Radiculopathy, lumbar region: Secondary | ICD-10-CM | POA: Diagnosis not present

## 2020-07-09 ENCOUNTER — Encounter: Payer: Medicare HMO | Admitting: Physical Therapy

## 2020-07-16 ENCOUNTER — Encounter: Payer: Medicare HMO | Admitting: Physical Therapy

## 2020-07-19 DIAGNOSIS — G4733 Obstructive sleep apnea (adult) (pediatric): Secondary | ICD-10-CM | POA: Diagnosis not present

## 2020-07-24 DIAGNOSIS — I10 Essential (primary) hypertension: Secondary | ICD-10-CM | POA: Diagnosis not present

## 2020-07-24 DIAGNOSIS — Z6831 Body mass index (BMI) 31.0-31.9, adult: Secondary | ICD-10-CM | POA: Diagnosis not present

## 2020-07-24 DIAGNOSIS — M5416 Radiculopathy, lumbar region: Secondary | ICD-10-CM | POA: Diagnosis not present

## 2020-08-06 DIAGNOSIS — R69 Illness, unspecified: Secondary | ICD-10-CM | POA: Diagnosis not present

## 2020-08-21 DIAGNOSIS — G4733 Obstructive sleep apnea (adult) (pediatric): Secondary | ICD-10-CM | POA: Diagnosis not present

## 2020-08-27 NOTE — Progress Notes (Signed)
[facility] [address] [place] [address] Phone: [phone] Subjective:   Patrick [name], am serving as a scribe for Dr. Antoine Primas. This visit occurred during the SARS-CoV-2 public health emergency.  Safety protocols were in place, including screening questions prior to the visit, additional usage of staff PPE, and extensive cleaning of exam room while observing appropriate contact time as indicated for disinfecting solutions.   I'm seeing this patient by the request  of:  Mahlon Gammon, MD  CC: back pain   [id number]  Patrick [name] is a 80 y.o. male coming in with complaint of back pain. Patient states that he was seeing [facility]. Has had 2 epidurals. First one was 2 years ago and then a second in December 2021. Has another epidural scheduled on January 18th. Pain is constant and is in right leg that started June of 2021 after attempting to lift something heavy. History of herniated disc. Tried physical therapy which did not help.  Has tried mm relaxers, Tylenol and gabapentin which have not helped either.  Patient is frustrated that the other injection did not seem to help.  Patient would like more of a long-term improvement if possible.  Last imaging that is available to me is a MRI of the lumbar spine in September 2020.  There she was found to have a herniated disc at L4-L5 that did have a right-sided L4 nerve root impingement. Patient states that he did have a new MRI last year at neurosurgery that stated no significant progression from previous MRI.     Past Medical History:  Diagnosis Date  . Hypertension   . Prostate cancer [facility])    Past Surgical History:  Procedure Laterality Date  . NOSE SURGERY  12/12/2019  . PROSTATE SURGERY  2005   Patrick [name]   Social History   Socioeconomic History  . Marital status: Married    Spouse name: Not on file  . Number of children: Not on file  . Years of education: Not on file   . Highest education level: Not on file  Occupational History  . Not on file  Tobacco Use  . Smoking status: Never Smoker  . Smokeless tobacco: Never Used  Vaping Use  . Vaping Use: Never used  Substance and Sexual Activity  . Alcohol use: Never  . Drug use: Not on file  . Sexual activity: Not on file  Other Topics Concern  . Not on file  Social History Narrative   Social History      Diet? n/a      Do you drink/eat things with caffeine? yes      Marital status?  Married                                  What year were you married? 1965      Do you live in a house, apartment, assisted living, condo, trailer, etc.? [place] [facility]       Is it one or more stories? 4      How many persons live in your home? 2      Do you have any pets in your home? (please list) no      Highest level of education completed? Masters      Current or past profession: clergy       Do you exercise?  yes                   Type & how often? Walk weights      Advanced Directives      Do you have a living will?yes       Do you have a DNR form?                                  If not, do you want to discuss one? yes      Do you have signed POA/HPOA for forms? yes      Functional Status      Do you have difficulty bathing or dressing yourself? no      Do you have difficulty preparing food or eating? no      Do you have difficulty managing your medications?no      Do you have difficulty managing your finances?no      Do you have difficulty affording your medications?no      Social Determinants of Health   Financial Resource Strain: Not on file  Food Insecurity: Not on file  Transportation Needs: Not on file  Physical Activity: Not on file  Stress: Not on file  Social Connections: Not on file   No Known Allergies Family History  Problem Relation Age of Onset  . Congestive Heart Failure Mother      Current Outpatient Medications (Cardiovascular):  .   amLODipine (NORVASC) 5 MG tablet, Take 1 tablet (5 mg total) by mouth daily. Marland Kitchen  atorvastatin (LIPITOR) 10 MG tablet, Take 1 tablet (10 mg total) by mouth daily. Marland Kitchen  lisinopril (ZESTRIL) 20 MG tablet, Take 1 tablet (20 mg total) by mouth daily.     Current Outpatient Medications (Other):  Marland Kitchen  Cholecalciferol (VITAMIN D3 PO), Take 50 mcg by mouth daily. Marland Kitchen  MAGNESIUM GLYCINATE PO, Take 400 mg by mouth daily.   Reviewed prior external information including notes and imaging from  primary care provider As well as notes that were available from care everywhere and other healthcare systems.  Past medical history, social, surgical and family history all reviewed in electronic medical record.  No pertanent information unless stated regarding to the chief complaint.   Review of Systems:  No headache, visual changes, nausea, vomiting, diarrhea, constipation, dizziness, abdominal pain, skin rash, fevers, chills, night sweats, weight loss, swollen lymph nodes, body aches, joint swelling, chest pain, shortness of breath, mood changes. POSITIVE muscle aches  Objective  Blood pressure 132/86, pulse 80, height 5\' 6"  (1.676 m), weight 192 lb (87.1 kg), SpO2 96 %.   General: No apparent distress alert and oriented x3 mood and affect normal, dressed appropriately.  HEENT: Pupils equal, extraocular movements intact  Respiratory: Patient's speak in full sentences and does not appear short of breath  Cardiovascular: No lower extremity edema, non tender, no erythema  Gait normal with good balance and coordination.  MSK:  Non tender with full range of motion and good stability and symmetric strength and tone of shoulders, elbows, wrist, hip, knee and ankles bilaterally.  Patient's low back does have some loss of lordosis.  Some tenderness to palpation in the paraspinal musculature of the lumbar spine right greater than left.  Patient does have some mild tightness with straight leg test on the right compared to  left but no true radicular symptoms.  Worsening pain with flexion of the back though in the standing position  more on the right side than the left as well.  Patient does strength and deep tendon reflexes are intact and symmetric.   Impression and Recommendations:     The above documentation has been reviewed and is accurate and complete Patrick Saa, DO

## 2020-08-28 ENCOUNTER — Other Ambulatory Visit: Payer: Self-pay

## 2020-08-28 ENCOUNTER — Encounter: Payer: Self-pay | Admitting: Family Medicine

## 2020-08-28 ENCOUNTER — Ambulatory Visit: Payer: Medicare HMO | Admitting: Family Medicine

## 2020-08-28 DIAGNOSIS — M5416 Radiculopathy, lumbar region: Secondary | ICD-10-CM

## 2020-08-28 NOTE — Patient Instructions (Signed)
Good to see you Go through with next injection Exercise 3 times a week See if [facility] will send notes and MRI Ice 20 mins 2 times a day  Tart cherry 800-900 mg at night See me again in 4-5 weeks after injeciton

## 2020-08-28 NOTE — Assessment & Plan Note (Signed)
Patient does have a right-sided L4 nerve root impingement.  Has had a herniated disc.  Known to have foraminal stenosis as well.  Patient is going to continue with no significant medications.  Patient states that he has done the gabapentin in the past.  We discussed over-the-counter medications that could be beneficial.  Discussed the potential for home exercises.  Patient will have another epidural and we will see how patient responds.  I do think he could be a candidate for nerve root injections if that is not long-term.  Could also be patient for may be radiofrequency ablation.  We will consider the possibility of osteopathic manipulation but with radicular symptoms do not want to start that at this time.  Patient will follow-up 4 weeks after the injection to see how he is responding.  We did discuss there is a possibility that surgical intervention may be necessary

## 2020-09-04 DIAGNOSIS — M5416 Radiculopathy, lumbar region: Secondary | ICD-10-CM | POA: Diagnosis not present

## 2020-09-14 ENCOUNTER — Telehealth: Payer: Self-pay | Admitting: Internal Medicine

## 2020-09-14 NOTE — Telephone Encounter (Signed)
Called both numbers listed for the patient and left messages to call back to get scheduled for their AWV. KM

## 2020-09-17 ENCOUNTER — Telehealth: Payer: Self-pay | Admitting: Internal Medicine

## 2020-09-17 NOTE — Telephone Encounter (Signed)
Left patient a vm to call in and get scheduled for an AWV, patient called back and stated he does not want to do the AWV and to not call back in regards to those. KM

## 2020-09-21 DIAGNOSIS — G4733 Obstructive sleep apnea (adult) (pediatric): Secondary | ICD-10-CM | POA: Diagnosis not present

## 2020-10-03 NOTE — Progress Notes (Signed)
[place] Phone: [phone] Subjective:   Patrick Patrick [name], am serving as a scribe for Dr. Hulan Saas. This visit occurred during the SARS-CoV-2 public health emergency.  Safety protocols were in place, including screening questions prior to the visit, additional usage of staff PPE, and extensive cleaning of exam room while observing appropriate contact time as indicated for disinfecting solutions.   I'm seeing this patient by the request  of:  Patrick Dad, MD  CC: Low back pain  [id number]   08/28/2020 Patient does have a right-sided L4 nerve root impingement.  Has had a herniated disc.  Known to have foraminal stenosis as well.  Patient is going to continue with Patrick [name] significant medications.  Patient states that he has done the gabapentin in the past.  We discussed over-the-counter medications that could be beneficial.  Discussed the potential for home exercises.  Patient will have another epidural and we will see how patient responds.  I do think he could be a candidate for nerve root injections if that is not long-term.  Could also be patient for may be radiofrequency ablation.  We will consider the possibility of osteopathic manipulation but with radicular symptoms do not want to start that at this time.  Patient will follow-up 4 weeks after the injection to see how he is responding.  We did discuss there is a possibility that surgical intervention may be necessary  Update 10/04/2020 Patrick Patrick [name] is a 80 y.o. male coming in with complaint of low back pain. Patient states that he did have epidural in January at [facility] that made his pain decrease by 30%. Pain in right glute persists. Standing for prolonged periods increases his pain.  Patient states that at night still has some difficulty with nighttime pain as well.  Patient denies any fevers or chills, any abnormal weight loss.  Patient states during  the day seems to do better but still the most aggravating aspect is the morning aspect.       Past Medical History:  Diagnosis Date  . Hypertension   . Prostate cancer [facility])    Past Surgical History:  Procedure Laterality Date  . NOSE SURGERY  12/12/2019  . PROSTATE SURGERY  2005   Patrick Patrick [name]   Social History   Socioeconomic History  . Marital status: Married    Spouse name: Not on file  . Number of children: Not on file  . Years of education: Not on file  . Highest education level: Not on file  Occupational History  . Not on file  Tobacco Use  . Smoking status: Never Smoker  . Smokeless tobacco: Never Used  Vaping Use  . Vaping Use: Never used  Substance and Sexual Activity  . Alcohol use: Never  . Drug use: Not on file  . Sexual activity: Not on file  Other Topics Concern  . Not on file  Social History Narrative   Social History      Diet? n/a      Do you drink/eat things with caffeine? yes      Marital status?  Married                                  What year were you married? 1965      Do you live in a house, apartment, assisted living, condo, trailer, etc.? [place]  [facility]       Is it one or more stories? 4      How many persons live in your home? 2      Do you have any pets in your home? (please list) Patrick [name]      Highest level of education completed? Masters      Current or past profession: clergy       Do you exercise?                   yes                   Type & how often? Walk weights      Advanced Directives      Do you have a living will?yes       Do you have a DNR form?                                  If not, do you want to discuss one? yes      Do you have signed POA/HPOA for forms? yes      Functional Status      Do you have difficulty bathing or dressing yourself? no      Do you have difficulty preparing food or eating? no      Do you have difficulty managing your medications?no      Do you have difficulty  managing your finances?no      Do you have difficulty affording your medications?Patrick [name]      Social Determinants of Health   Financial Resource Strain: Not on file  Food Insecurity: Not on file  Transportation Needs: Not on file  Physical Activity: Not on file  Stress: Not on file  Social Connections: Not on file   Patrick [name] Known Allergies Family History  Problem Relation Age of Onset  . Congestive Heart Failure Mother      Current Outpatient Medications (Cardiovascular):  .  amLODipine (NORVASC) 5 MG tablet, Take 1 tablet (5 mg total) by mouth daily. Marland Kitchen  atorvastatin (LIPITOR) 10 MG tablet, Take 1 tablet (10 mg total) by mouth daily. Marland Kitchen  lisinopril (ZESTRIL) 20 MG tablet, Take 1 tablet (20 mg total) by mouth daily.     Current Outpatient Medications (Other):  Marland Kitchen  Cholecalciferol (VITAMIN D3 PO), Take 50 mcg by mouth daily. Marland Kitchen  MAGNESIUM GLYCINATE PO, Take 400 mg by mouth daily.   Reviewed prior external information including notes and imaging from  primary care provider As well as notes that were available from care everywhere and other healthcare systems.  Past medical history, social, surgical and family history all reviewed in electronic medical record.  Patrick [name] pertanent information unless stated regarding to the chief complaint.   Review of Systems:  Patrick [name] headache, visual changes, nausea, vomiting, diarrhea, constipation, dizziness, abdominal pain, skin rash, fevers, chills, night sweats, weight loss, swollen lymph nodes, body aches, joint swelling, chest pain, shortness of breath, mood changes. POSITIVE muscle aches  Objective  Blood pressure 116/70, pulse 91, height 5\' 6"  (1.676 m), weight 192 lb (87.1 kg), SpO2 96 %.   General: Patrick [name] apparent distress alert and oriented x3 mood and affect normal, dressed appropriately.  HEENT: Pupils equal, extraocular movements intact  Respiratory: Patient's speak in full sentences and does not appear short of breath  Cardiovascular: Patrick [name] lower  extremity edema, non tender, Patrick [name] erythema  Gait normal with good balance  and coordination.  MSK: Lower back exam does show some mild loss of lordosis.  Very minimal tenderness around the paraspinal musculature of L5 and S1.  Tightness noted on the right greater trochanteric area.  Mild positive FABER test.  Negative straight leg test.  After verbal consent patient was prepped with alcohol swab and with a 21-gauge 2 inch needle injected into the right greater trochanteric area with a total of 1 cc of 0.5% Marcaine and 1 cc of Kenalog 40 mg/mL Patrick [name] blood loss.  Band-Aid placed.  Postinjection instructions given    Impression and Recommendations:     The above documentation has been reviewed and is accurate and complete Patrick Pulley, DO

## 2020-10-04 ENCOUNTER — Other Ambulatory Visit: Payer: Self-pay

## 2020-10-04 ENCOUNTER — Ambulatory Visit: Payer: Medicare HMO | Admitting: Family Medicine

## 2020-10-04 ENCOUNTER — Encounter: Payer: Self-pay | Admitting: Family Medicine

## 2020-10-04 DIAGNOSIS — M5416 Radiculopathy, lumbar region: Secondary | ICD-10-CM

## 2020-10-04 DIAGNOSIS — M7061 Trochanteric bursitis, right hip: Secondary | ICD-10-CM | POA: Diagnosis not present

## 2020-10-04 NOTE — Assessment & Plan Note (Signed)
Patient given injection today, tolerated the procedure well.  We will see how patient responds to this.  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  Discussed posture and ergonomics.  Discussed home exercises.  Patient did bring up the idea of Ativan discussed that that would need to be prescribed by primary care provider or need to consider another sleep study.  Patient could be a candidate for osteopathic manipulation when he follows up 6 weeks if no significant radicular symptoms.

## 2020-10-04 NOTE — Patient Instructions (Signed)
GT injection today Keep doing exercises Ice before bed Sleep could be playing a role Keep working on getting to surgery See me in 6 months

## 2020-10-04 NOTE — Assessment & Plan Note (Signed)
Seen on previous MRI does have some mild improvement with the epidural.  Possible candidate for nerve root injections as well as the potential for radiofrequency ablation.  May need to consider this.  Patient wants to continue the conservative therapy at this time.  Has done gabapentin in the past with no improvement.  Follow-up with me again in 6 to 8 weeks

## 2020-10-19 DIAGNOSIS — G4733 Obstructive sleep apnea (adult) (pediatric): Secondary | ICD-10-CM | POA: Diagnosis not present

## 2020-11-14 NOTE — Progress Notes (Signed)
[place] Phone: [phone] Subjective:   Patrick [name], am serving as a scribe for Dr. Hulan Saas. This visit occurred during the SARS-CoV-2 public health emergency.  Safety protocols were in place, including screening questions prior to the visit, additional usage of staff PPE, and extensive cleaning of exam room while observing appropriate contact time as indicated for disinfecting solutions.   I'm seeing this patient by the request  of:  Virgie Dad, MD  CC: Right hip and low back pain follow-up  [id number]   10/04/2020 Seen on previous MRI does have some mild improvement with the epidural.  Possible candidate for nerve root injections as well as the potential for radiofrequency ablation.  May need to consider this.  Patient wants to continue the conservative therapy at this time.  Has done gabapentin in the past with [name] improvement.  Follow-up with me again in 6 to 8 weeks  Patient given injection today, tolerated the procedure well.  We will see how patient responds to this.  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  Discussed posture and ergonomics.  Discussed home exercises.  Patient did bring up the idea of Ativan discussed that that would need to be prescribed by primary care provider or need to consider another sleep study.  Patient could be a candidate for osteopathic manipulation when he follows up 6 weeks if [name] significant radicular symptoms.  Update 11/15/2020 Patrick [name] is a 80 y.o. male coming in with complaint of R hip and low back pain. Patient states that pain in R hip is less after the injection. Pain occurs with walking. Back pain is intermittent. Stretching helps his back pain.  Patient feels like he is making good progress.  Feels like he is about 80% better.  Able to walk and work out on a regular basis at this time.     Past Medical History:  Diagnosis Date  .  Hypertension   . Prostate cancer [facility])    Past Surgical History:  Procedure Laterality Date  . NOSE SURGERY  12/12/2019  . PROSTATE SURGERY  2005   [name]   Social History   Socioeconomic History  . Marital status: Married    Spouse name: Not on file  . Number of children: Not on file  . Years of education: Not on file  . Highest education level: Not on file  Occupational History  . Not on file  Tobacco Use  . Smoking status: Never Smoker  . Smokeless tobacco: Never Used  Vaping Use  . Vaping Use: Never used  Substance and Sexual Activity  . Alcohol use: Never  . Drug use: Not on file  . Sexual activity: Not on file  Other Topics Concern  . Not on file  Social History Narrative   Social History      Diet? n/a      Do you drink/eat things with caffeine? yes      Marital status?  Married                                  What year were you married? 1965      Do you live in a house, apartment, assisted living, condo, trailer, etc.? [place]       Is it one or more stories? 4      How many  persons live in your home? 2      Do you have any pets in your home? (please list) [name]      Highest level of education completed? Masters      Current or past profession: clergy       Do you exercise?                   yes                   Type & how often? Walk weights      Advanced Directives      Do you have a living will?yes       Do you have a DNR form?                                  If not, do you want to discuss one? yes      Do you have signed POA/HPOA for forms? yes      Functional Status      Do you have difficulty bathing or dressing yourself? no      Do you have difficulty preparing food or eating? no      Do you have difficulty managing your medications?no      Do you have difficulty managing your finances?no      Do you have difficulty affording your medications?[name]      Social Determinants of Health   Financial Resource  Strain: Not on file  Food Insecurity: Not on file  Transportation Needs: Not on file  Physical Activity: Not on file  Stress: Not on file  Social Connections: Not on file   [name] Known Allergies Family History  Problem Relation Age of Onset  . Congestive Heart Failure Mother      Current Outpatient Medications (Cardiovascular):  .  amLODipine (NORVASC) 5 MG tablet, Take 1 tablet (5 mg total) by mouth daily. Marland Kitchen  atorvastatin (LIPITOR) 10 MG tablet, Take 1 tablet (10 mg total) by mouth daily. Marland Kitchen  lisinopril (ZESTRIL) 20 MG tablet, Take 1 tablet (20 mg total) by mouth daily.     Current Outpatient Medications (Other):  Marland Kitchen  Cholecalciferol (VITAMIN D3 PO), Take 50 mcg by mouth daily. Marland Kitchen  MAGNESIUM GLYCINATE PO, Take 400 mg by mouth daily.   Reviewed prior external information including notes and imaging from  primary care provider As well as notes that were available from care everywhere and other healthcare systems.  Past medical history, social, surgical and family history all reviewed in electronic medical record.  [name] pertanent information unless stated regarding to the chief complaint.   Review of Systems:  [name] headache, visual changes, nausea, vomiting, diarrhea, constipation, dizziness, abdominal pain, skin rash, fevers, chills, night sweats, weight loss, swollen lymph nodes, body aches, joint swelling, chest pain, shortness of breath, mood changes. POSITIVE muscle aches, mild  Objective  Blood pressure 138/84, pulse 87, height 5\' 6"  (1.676 m), weight 192 lb (87.1 kg), SpO2 96 %.   General: [name] apparent distress alert and oriented x3 mood and affect normal, dressed appropriately.  HEENT: Pupils equal, extraocular movements intact  Respiratory: Patient's speak in full sentences and does not appear short of breath  Cardiovascular: [name] lower extremity edema, non tender, [name] erythema  Gait normal with good balance and coordination.  MSK: Arthritic changes of multiple joints Patient  has improvement in the range of motion of the hips.  Minimal tenderness over the right greater trochanteric area.  Still some loss of lordosis of the back noted.  Mild limitation of range of motion of the back but mostly with extension of greater than 10 degrees    Impression and Recommendations:     The above documentation has been reviewed and is accurate and complete Lyndal Pulley, DO

## 2020-11-15 ENCOUNTER — Other Ambulatory Visit: Payer: Self-pay

## 2020-11-15 ENCOUNTER — Ambulatory Visit: Payer: Medicare HMO | Admitting: Family Medicine

## 2020-11-15 ENCOUNTER — Encounter: Payer: Self-pay | Admitting: Family Medicine

## 2020-11-15 DIAGNOSIS — M7061 Trochanteric bursitis, right hip: Secondary | ICD-10-CM

## 2020-11-15 NOTE — Patient Instructions (Signed)
Glad you are doing well Stay active See me again in 3 months if you need me

## 2020-11-15 NOTE — Assessment & Plan Note (Signed)
Patient feels significantly better after this injection.  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  Follow-up again in 3 months

## 2020-11-19 DIAGNOSIS — G4733 Obstructive sleep apnea (adult) (pediatric): Secondary | ICD-10-CM | POA: Diagnosis not present

## 2020-11-23 ENCOUNTER — Other Ambulatory Visit: Payer: Self-pay | Admitting: Internal Medicine

## 2020-11-28 ENCOUNTER — Encounter: Payer: Self-pay | Admitting: Family Medicine

## 2020-11-29 MED ORDER — PREDNISONE 20 MG PO TABS: 20.0000 mg | ORAL_TABLET | Freq: Every day | ORAL | 0 refills | Status: DC

## 2020-12-04 DIAGNOSIS — J342 Deviated nasal septum: Secondary | ICD-10-CM | POA: Insufficient documentation

## 2020-12-04 DIAGNOSIS — G4733 Obstructive sleep apnea (adult) (pediatric): Secondary | ICD-10-CM | POA: Diagnosis not present

## 2020-12-05 ENCOUNTER — Telehealth: Payer: Self-pay | Admitting: Family Medicine

## 2020-12-05 DIAGNOSIS — G4733 Obstructive sleep apnea (adult) (pediatric): Secondary | ICD-10-CM | POA: Diagnosis not present

## 2020-12-05 DIAGNOSIS — Z9989 Dependence on other enabling machines and devices: Secondary | ICD-10-CM | POA: Diagnosis not present

## 2020-12-05 NOTE — Telephone Encounter (Signed)
Left message for patient to call back to discuss where he wants epidural order sent.

## 2020-12-05 NOTE — Telephone Encounter (Signed)
Pt would like to move forward with an epidural at [place], asap.

## 2020-12-05 NOTE — Telephone Encounter (Signed)
Epidural at L4/L5 per a verbal from Dr. Tamala Julian

## 2020-12-06 ENCOUNTER — Other Ambulatory Visit: Payer: Self-pay

## 2020-12-06 DIAGNOSIS — M5416 Radiculopathy, lumbar region: Secondary | ICD-10-CM

## 2020-12-06 NOTE — Telephone Encounter (Signed)
Pt scheduled for 4/22

## 2020-12-07 ENCOUNTER — Ambulatory Visit
Admission: RE | Admit: 2020-12-07 | Discharge: 2020-12-07 | Disposition: A | Discharge status: Home / Self Care | Payer: Medicare HMO | Source: Ambulatory Visit | Attending: Family Medicine | Admitting: Family Medicine

## 2020-12-07 DIAGNOSIS — M5416 Radiculopathy, lumbar region: Secondary | ICD-10-CM

## 2020-12-07 DIAGNOSIS — M47817 Spondylosis without myelopathy or radiculopathy, lumbosacral region: Secondary | ICD-10-CM | POA: Diagnosis not present

## 2020-12-07 DIAGNOSIS — M5126 Other intervertebral disc displacement, lumbar region: Secondary | ICD-10-CM | POA: Diagnosis not present

## 2020-12-07 MED ORDER — METHYLPREDNISOLONE ACETATE 40 MG/ML INJ SUSP (RADIOLOG
80.0000 mg | Freq: Once | INTRAMUSCULAR | Status: AC
  Administered 2020-12-07: 80 mg via EPIDURAL

## 2020-12-07 MED ORDER — IOPAMIDOL (ISOVUE-M 200) INJECTION 41%
1.0000 mL | Freq: Once | INTRAMUSCULAR | Status: AC
  Administered 2020-12-07: 1 mL via EPIDURAL

## 2020-12-07 NOTE — Discharge Instructions (Signed)

## 2020-12-19 ENCOUNTER — Telehealth: Payer: Self-pay

## 2020-12-19 ENCOUNTER — Non-Acute Institutional Stay (INDEPENDENT_AMBULATORY_CARE_PROVIDER_SITE_OTHER): Payer: Medicare HMO | Admitting: Family Medicine

## 2020-12-19 ENCOUNTER — Encounter: Payer: Self-pay | Admitting: Family Medicine

## 2020-12-19 ENCOUNTER — Other Ambulatory Visit: Payer: Self-pay

## 2020-12-19 VITALS — BP 138/62 | HR 81 | Temp 98.0°F | Resp 16 | Ht 66.0 in | Wt 190.6 lb

## 2020-12-19 DIAGNOSIS — E7800 Pure hypercholesterolemia, unspecified: Secondary | ICD-10-CM

## 2020-12-19 DIAGNOSIS — G4733 Obstructive sleep apnea (adult) (pediatric): Secondary | ICD-10-CM | POA: Diagnosis not present

## 2020-12-19 DIAGNOSIS — N5231 Erectile dysfunction following radical prostatectomy: Secondary | ICD-10-CM

## 2020-12-19 DIAGNOSIS — I10 Essential (primary) hypertension: Secondary | ICD-10-CM

## 2020-12-19 DIAGNOSIS — C61 Malignant neoplasm of prostate: Secondary | ICD-10-CM

## 2020-12-19 MED ORDER — SILDENAFIL CITRATE 100 MG PO TABS: 100.0000 mg | ORAL_TABLET | Freq: Every day | ORAL | 11 refills | Status: DC | PRN

## 2020-12-19 NOTE — Patient Instructions (Signed)
We will plan to do lab work in the morning and then discontinue atorvastatin but recheck lipids in 6 months Experiment with your blood pressure pills as we discussed leaving one off and monitoring her blood pressure to see if that affects your ED.  If no effect then try either 1 same way.  If still no effect and then go ahead and take the sildenafil or Viagra

## 2020-12-19 NOTE — Telephone Encounter (Signed)
Patient states Viagra prescription sent in was very expensive and would like to have it sent to a different pharmacy to see if he can get it cheaper.  If possible send to [facility].  Please advise. Thank you.

## 2020-12-19 NOTE — Progress Notes (Signed)
Provider:  Alain Honey, MD  Careteam: Patient Care Team: [name], MD as PCP - General (Family Medicine)  PLACE OF SERVICE:  [place] Directive information Does Patient Have a Medical Advance Directive?: Yes, Type of Advance Directive: [place];Living will, Does patient want to make changes to medical advance directive?: No - Patient declined  Allergies  Allergen Reactions  . Lamisil [Terbinafine]   . Sulfa Antibiotics     Chief Complaint  Patient presents with  . Annual Exam    Physical   . Health Maintenance    Discuss the need for Hepatitis C Screening.  . Immunizations    Discuss the need for Tetanus Vaccine.     HPI: Patient is a 80 y.o. male he presents today for annual physical.  He has some chronic issues with low back pain and disc disease as well as trochanteric bursitis.  He has seen the appropriate specialist regarding both those problems. Otherwise he has some hypertension and elevated cholesterol. He has 2 other issues today he complains of some pain and numbness in his feet especially at night.  Sounds like this may be Also concerned about erectile dysfunction.  He is status post radical prostatectomy.  Problem began then.  He is also on 2 different antihypertensives which may contribute.  Review of Systems:  Review of Systems  Constitutional: Negative.   HENT: Negative.   Eyes: Negative.   Respiratory: Negative.   Cardiovascular: Negative.   Genitourinary: Negative.   Musculoskeletal: Negative.   Skin: Negative.   Neurological: Negative.   Endo/Heme/Allergies: Negative.   Psychiatric/Behavioral: Negative.   All other systems reviewed and are negative.   Past Medical History:  Diagnosis Date  . Hypertension   . Prostate cancer [facility])    Past Surgical History:  Procedure Laterality Date  . NOSE SURGERY  12/12/2019  . PROSTATE SURGERY  2005   [name]   Social History:   reports that he has  never smoked. He has never used smokeless tobacco. He reports that he does not drink alcohol. No history on file for drug use.  Family History  Problem Relation Age of Onset  . Congestive Heart Failure Mother     Medications: Patient's Medications  New Prescriptions   No medications on file  Previous Medications   AMLODIPINE (NORVASC) 5 MG TABLET    Take 1 tablet (5 mg total) by mouth daily.   ATORVASTATIN (LIPITOR) 10 MG TABLET    Take 1 tablet (10 mg total) by mouth daily.   CHOLECALCIFEROL (VITAMIN D3 PO)    Take 50 mcg by mouth daily.   LISINOPRIL (ZESTRIL) 20 MG TABLET    TAKE 1 TABLET BY MOUTH EVERY DAY   MAGNESIUM GLYCINATE PO    Take 400 mg by mouth daily.   PREDNISONE (DELTASONE) 20 MG TABLET    Take 1 tablet (20 mg total) by mouth daily with breakfast.  Modified Medications   No medications on file  Discontinued Medications   No medications on file    Physical Exam:  There were no vitals filed for this visit. There is no height or weight on file to calculate BMI. Wt Readings from Last 3 Encounters:  11/15/20 192 lb (87.1 kg)  10/04/20 192 lb (87.1 kg)  08/28/20 192 lb (87.1 kg)    Physical Exam Vitals and nursing note reviewed.  HENT:     Head: Normocephalic.     Right Ear: Tympanic membrane normal.  Left Ear: Tympanic membrane normal.     Nose: Nose normal.  Cardiovascular:     Rate and Rhythm: Normal rate and regular rhythm.     Pulses: Normal pulses.  Pulmonary:     Effort: Pulmonary effort is normal.     Breath sounds: Normal breath sounds.  Musculoskeletal:        General: Normal range of motion.     Cervical back: Normal range of motion.  Neurological:     General: No focal deficit present.     Mental Status: He is alert and oriented to person, place, and time.     Labs reviewed: Basic Metabolic Panel: Recent Labs    12/27/19 0800  NA 142  K 4.3  CL 107  CO2 30  GLUCOSE 100*  BUN 16  CREATININE 1.19*  CALCIUM 8.9  TSH 1.79    Liver Function Tests: Recent Labs    12/27/19 0800  AST 17  ALT 16  BILITOT 0.8  PROT 5.9*   No results for input(s): LIPASE, AMYLASE in the last 8760 hours. No results for input(s): AMMONIA in the last 8760 hours. CBC: Recent Labs    12/27/19 0800  WBC 7.2  NEUTROABS 4,579  HGB 13.8  HCT 42.2  MCV 81.0  PLT 172   Lipid Panel: Recent Labs    12/27/19 0800  CHOL 161  HDL 54  LDLCALC 87  TRIG 107  CHOLHDL 3.0   TSH: Recent Labs    12/27/19 0800  TSH 1.79   A1C: Lab Results  Component Value Date   HGBA1C 5.4 12/27/2019     Assessment/Plan  1. Erectile dysfunction after radical prostatectomy I suspect his ED is multifactorial.  Main issue is probably secondary to radical prostatectomy.  He is not interested in further evaluation such as checking testosterone etc.  I will give him a prescription for sildenafil but would also like him to try and withhold 1 blood pressure pill while monitoring for 2 weeks.  If that does not help try other blood pressure pill discontinuation for 2 weeks.  If that does not help ED problem then go ahead with a trial of sildenafil - sildenafil (VIAGRA) 100 MG tablet; Take 1 tablet (100 mg total) by mouth daily as needed for erectile dysfunction.  Dispense: 30 tablet; Refill: 11  2. Essential hypertension Blood pressure is well controlled today at 138/62 continue medicines but try discontinuation 1 at a time as described above  3. Hypercholesterolemia Lipids were last assessed 1 year ago.  LDL at that time was 87 with only 10 mg of atorvastatin.  I think we could likely stop the atorvastatin and repeat lipids in 6 months.  4. 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  PSAs were checked for some time after the surgery and surveillance has been discontinued   Alain Honey, MD [employer] [phone]

## 2020-12-21 ENCOUNTER — Encounter: Payer: Medicare HMO | Admitting: Internal Medicine

## 2020-12-24 MED ORDER — SILDENAFIL CITRATE 100 MG PO TABS: 100.0000 mg | ORAL_TABLET | Freq: Every day | ORAL | 11 refills | Status: DC | PRN

## 2020-12-24 NOTE — Telephone Encounter (Signed)
I found a coupon on [facility] at [facility] for $11 for 39 pills of Sildenafil, 100 mg

## 2020-12-25 DIAGNOSIS — B351 Tinea unguium: Secondary | ICD-10-CM | POA: Diagnosis not present

## 2020-12-25 DIAGNOSIS — M79671 Pain in right foot: Secondary | ICD-10-CM | POA: Diagnosis not present

## 2020-12-25 DIAGNOSIS — L84 Corns and callosities: Secondary | ICD-10-CM | POA: Diagnosis not present

## 2020-12-25 DIAGNOSIS — M79672 Pain in left foot: Secondary | ICD-10-CM | POA: Diagnosis not present

## 2020-12-31 DIAGNOSIS — Z20822 Contact with and (suspected) exposure to covid-19: Secondary | ICD-10-CM | POA: Diagnosis not present

## 2021-01-15 NOTE — Progress Notes (Signed)
[place] Phone: [phone] Subjective:   Fontaine No, am serving as a scribe for Dr. Hulan Saas. This visit occurred during the SARS-CoV-2 public health emergency.  Safety protocols were in place, including screening questions prior to the visit, additional usage of staff PPE, and extensive cleaning of exam room while observing appropriate contact time as indicated for disinfecting solutions.   I'm seeing this patient by the request  of:  [name], MD  CC: Back pain follow-up  ZWC:HENIDPOEUM   11/15/2020 Patient feels significantly better after this injection.  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  Follow-up again in 3 months  Update 01/16/2021 Alistair Senft is a 80 y.o. male coming in with complaint of R hip, greater trochanteric bursitis. Epidural 12/07/2020. Patient states that he continues to have R ankle and R glute pain that is constant but less than what it was before the epidural. Pain in lower back is present in mornings until he gets warmed up. Patient traveled by plane and his back pain did not bother him but the other areas were problematic.        Past Medical History:  Diagnosis Date  . Hypertension   . Prostate cancer [facility])    Past Surgical History:  Procedure Laterality Date  . NOSE SURGERY  12/12/2019  . PROSTATE SURGERY  2005   [name]   Social History   Socioeconomic History  . Marital status: Married    Spouse name: Not on file  . Number of children: Not on file  . Years of education: Not on file  . Highest education level: Not on file  Occupational History  . Not on file  Tobacco Use  . Smoking status: Never Smoker  . Smokeless tobacco: Never Used  Vaping Use  . Vaping Use: Never used  Substance and Sexual Activity  . Alcohol use: Never  .  Drug use: Not on file  . Sexual activity: Not on file  Other Topics Concern  . Not on file  Social History Narrative   Social History      Diet? n/a      Do you drink/eat things with caffeine? yes      Marital status?  Married                                  What year were you married? 1965      Do you live in a house, apartment, assisted living, condo, trailer, etc.? [place]       Is it one or more stories? 4      How many persons live in your home? 2      Do you have any pets in your home? (please list) no      Highest level of education completed? Masters      Current or past profession: clergy       Do you exercise?                   yes                   Type & how often? Walk weights      Advanced Directives      Do  you have a living will?yes       Do you have a DNR form?                                  If not, do you want to discuss one? yes      Do you have signed POA/HPOA for forms? yes      Functional Status      Do you have difficulty bathing or dressing yourself? no      Do you have difficulty preparing food or eating? no      Do you have difficulty managing your medications?no      Do you have difficulty managing your finances?no      Do you have difficulty affording your medications?no      Social Determinants of Health   Financial Resource Strain: Not on file  Food Insecurity: Not on file  Transportation Needs: Not on file  Physical Activity: Not on file  Stress: Not on file  Social Connections: Not on file   Allergies  Allergen Reactions  . Lamisil [Terbinafine]   . Sulfa Antibiotics    Family History  Problem Relation Age of Onset  . Congestive Heart Failure Mother     Current Outpatient Medications (Endocrine & Metabolic):  .  predniSONE (DELTASONE) 20 MG tablet, Take 1 tablet (20 mg total) by mouth daily with breakfast.  Current Outpatient Medications (Cardiovascular):  .  amLODipine (NORVASC) 5 MG tablet,  Take 1 tablet (5 mg total) by mouth daily. Marland Kitchen  atorvastatin (LIPITOR) 10 MG tablet, Take 1 tablet (10 mg total) by mouth daily. Marland Kitchen  lisinopril (ZESTRIL) 20 MG tablet, TAKE 1 TABLET BY MOUTH EVERY DAY .  sildenafil (VIAGRA) 100 MG tablet, Take 1 tablet (100 mg total) by mouth daily as needed for erectile dysfunction.     Current Outpatient Medications (Other):  Marland Kitchen  Cholecalciferol (VITAMIN D3 PO), Take 50 mcg by mouth daily. Marland Kitchen  MAGNESIUM GLYCINATE PO, Take 400 mg by mouth daily.   Reviewed prior external information including notes and imaging from  primary care provider As well as notes that were available from care everywhere and other healthcare systems.  Past medical history, social, surgical and family history all reviewed in electronic medical record.  No pertanent information unless stated regarding to the chief complaint.   Review of Systems:  No headache, visual changes, nausea, vomiting, diarrhea, constipation, dizziness, abdominal pain, skin rash, fevers, chills, night sweats, weight loss, swollen lymph nodes,  joint swelling, chest pain, shortness of breath, mood changes. POSITIVE muscle aches, body aches  Objective  Blood pressure 112/72, pulse 86, height 5\' 6"  (1.676 m), weight 189 lb (85.7 kg), SpO2 96 %.   General: No apparent distress alert and oriented x3 mood and affect normal, dressed appropriately.  HEENT: Pupils equal, extraocular movements intact  Respiratory: Patient's speak in full sentences and does not appear short of breath  Cardiovascular: No lower extremity edema, non tender, no erythema  Gait normal with good balance and coordination.  MSK: Arthritic changes in multiple joints Low back exam shows the patient does have more tenderness to palpation of the paraspinal musculature of the lumbar spine right greater than left.  Positive Faber on the right side.  Negative straight leg test..    After verbal consent patient was prepped with alcohol swab and with a  21-gauge 2 inch needle injected into the right piriformis muscle more  proximally and then distally.  Total of 1 cc of 0.5% Marcaine and 1 cc of Kenalog 40 mg/mL used.  No blood loss.  Postinjection instructions given including watching for any signs of infectious etiology or neurologic compromise.  Band-Aid placed.   Impression and Recommendations:     The above documentation has been reviewed and is accurate and complete Lyndal Pulley, DO

## 2021-01-16 ENCOUNTER — Ambulatory Visit: Payer: Medicare HMO | Admitting: Family Medicine

## 2021-01-16 ENCOUNTER — Encounter: Payer: Self-pay | Admitting: Family Medicine

## 2021-01-16 ENCOUNTER — Other Ambulatory Visit: Payer: Self-pay

## 2021-01-16 DIAGNOSIS — M5416 Radiculopathy, lumbar region: Secondary | ICD-10-CM

## 2021-01-16 NOTE — Patient Instructions (Signed)
Injection today Write [place] in 2 weeks and we can repeat epidural if not better See me in 6-8 weeks

## 2021-01-16 NOTE — Assessment & Plan Note (Signed)
Patient has responded somewhat to the nerve root injection.  Patient is 50% better.  Attempted a piriformis injection to see if that is responding as well.  He did not feel like he made significant improvement with the greater trochanteric injection.  In 2 weeks if patient does not make much improvement would repeat the nerve root injection again.  Patient is in agreement with the plan.  We will have her follow-up in 6 to 8 weeks and continue conservative therapy otherwise.

## 2021-01-19 DIAGNOSIS — G4733 Obstructive sleep apnea (adult) (pediatric): Secondary | ICD-10-CM | POA: Diagnosis not present

## 2021-01-30 ENCOUNTER — Encounter: Payer: Self-pay | Admitting: Family Medicine

## 2021-01-31 ENCOUNTER — Encounter: Payer: Self-pay | Admitting: Family Medicine

## 2021-01-31 DIAGNOSIS — G4733 Obstructive sleep apnea (adult) (pediatric): Secondary | ICD-10-CM | POA: Diagnosis not present

## 2021-01-31 NOTE — Telephone Encounter (Signed)
Forwarded message to [facility] due to Dr. Sabra Heck out of office and patient resident of [facility]

## 2021-01-31 NOTE — Telephone Encounter (Signed)
Forwarded to Dr. Sabra Heck due to OV Note:   Instructions   Return in about 6 months (around 06/21/2021). We will plan to do lab work in the morning and then discontinue atorvastatin but recheck lipids in 6 months Experiment with your blood pressure pills as we discussed leaving one off and monitoring her blood pressure to see if that affects your ED.  If no effect then try either 1 same way.  If still no effect and then go ahead and take the sildenafil or Viagra

## 2021-02-01 ENCOUNTER — Other Ambulatory Visit: Payer: Self-pay

## 2021-02-01 DIAGNOSIS — M5416 Radiculopathy, lumbar region: Secondary | ICD-10-CM

## 2021-02-01 DIAGNOSIS — G4733 Obstructive sleep apnea (adult) (pediatric): Secondary | ICD-10-CM | POA: Diagnosis not present

## 2021-02-10 ENCOUNTER — Encounter: Payer: Self-pay | Admitting: Family Medicine

## 2021-02-14 ENCOUNTER — Ambulatory Visit: Payer: Medicare HMO | Admitting: Family Medicine

## 2021-02-19 ENCOUNTER — Other Ambulatory Visit: Payer: Self-pay

## 2021-02-19 ENCOUNTER — Encounter: Payer: Self-pay | Admitting: Family Medicine

## 2021-02-19 DIAGNOSIS — M545 Low back pain, unspecified: Secondary | ICD-10-CM

## 2021-02-19 DIAGNOSIS — G8929 Other chronic pain: Secondary | ICD-10-CM

## 2021-02-20 ENCOUNTER — Other Ambulatory Visit: Payer: Self-pay

## 2021-02-20 DIAGNOSIS — M5416 Radiculopathy, lumbar region: Secondary | ICD-10-CM

## 2021-02-20 NOTE — Progress Notes (Signed)
Order placed

## 2021-02-20 NOTE — Telephone Encounter (Signed)
Has been faxed he may want to give it a day before calling to make sure that the fax has made it through

## 2021-02-20 NOTE — Telephone Encounter (Signed)
Referral placed. Coordinator will let me know once sent to Dr. Arrie Eastern so that patient can be notified to schedule.

## 2021-02-20 NOTE — Telephone Encounter (Signed)
I received a call from Dr Morrell Riddle office stating that the patient called them regarding this order. They have not received anything from [place]. She also said that he would need an appointment with their office before an injection can be done. She expressed that he was rude on the phone and was expecting this injection to be done ASAP (which they will not be able to do).  If he is wanting this done sooner rather than later, she suggested trying another office.  Please advise.

## 2021-02-21 ENCOUNTER — Other Ambulatory Visit: Payer: Self-pay | Admitting: Internal Medicine

## 2021-02-21 NOTE — Telephone Encounter (Signed)
Left patient VM that referral was resent to Dr. Morrell Riddle office and he should be able to contact them to schedule.

## 2021-02-22 DIAGNOSIS — Z961 Presence of intraocular lens: Secondary | ICD-10-CM | POA: Diagnosis not present

## 2021-02-22 DIAGNOSIS — H43813 Vitreous degeneration, bilateral: Secondary | ICD-10-CM | POA: Diagnosis not present

## 2021-02-22 DIAGNOSIS — H524 Presbyopia: Secondary | ICD-10-CM | POA: Diagnosis not present

## 2021-02-22 DIAGNOSIS — H11153 Pinguecula, bilateral: Secondary | ICD-10-CM | POA: Diagnosis not present

## 2021-02-22 DIAGNOSIS — H5212 Myopia, left eye: Secondary | ICD-10-CM | POA: Diagnosis not present

## 2021-02-22 DIAGNOSIS — H11123 Conjunctival concretions, bilateral: Secondary | ICD-10-CM | POA: Diagnosis not present

## 2021-02-22 DIAGNOSIS — H04123 Dry eye syndrome of bilateral lacrimal glands: Secondary | ICD-10-CM | POA: Diagnosis not present

## 2021-02-23 ENCOUNTER — Other Ambulatory Visit: Payer: Self-pay | Admitting: Internal Medicine

## 2021-03-08 ENCOUNTER — Encounter: Payer: Self-pay | Admitting: Family Medicine

## 2021-03-08 DIAGNOSIS — G4733 Obstructive sleep apnea (adult) (pediatric): Secondary | ICD-10-CM | POA: Diagnosis not present

## 2021-03-14 ENCOUNTER — Encounter: Payer: Self-pay | Admitting: Family Medicine

## 2021-03-22 DIAGNOSIS — M48061 Spinal stenosis, lumbar region without neurogenic claudication: Secondary | ICD-10-CM | POA: Diagnosis not present

## 2021-03-22 DIAGNOSIS — M5126 Other intervertebral disc displacement, lumbar region: Secondary | ICD-10-CM | POA: Diagnosis not present

## 2021-03-22 DIAGNOSIS — M5416 Radiculopathy, lumbar region: Secondary | ICD-10-CM | POA: Diagnosis not present

## 2021-03-22 DIAGNOSIS — M5136 Other intervertebral disc degeneration, lumbar region: Secondary | ICD-10-CM | POA: Diagnosis not present

## 2021-03-27 ENCOUNTER — Non-Acute Institutional Stay (INDEPENDENT_AMBULATORY_CARE_PROVIDER_SITE_OTHER): Payer: Medicare HMO | Admitting: Family Medicine

## 2021-03-27 ENCOUNTER — Other Ambulatory Visit: Payer: Self-pay

## 2021-03-27 ENCOUNTER — Encounter: Payer: Self-pay | Admitting: Family Medicine

## 2021-03-27 VITALS — BP 116/74 | HR 90 | Temp 98.4°F | Ht 66.0 in | Wt 191.4 lb

## 2021-03-27 DIAGNOSIS — M5442 Lumbago with sciatica, left side: Secondary | ICD-10-CM | POA: Diagnosis not present

## 2021-03-27 DIAGNOSIS — G8929 Other chronic pain: Secondary | ICD-10-CM

## 2021-03-27 DIAGNOSIS — I10 Essential (primary) hypertension: Secondary | ICD-10-CM | POA: Diagnosis not present

## 2021-03-27 DIAGNOSIS — E7800 Pure hypercholesterolemia, unspecified: Secondary | ICD-10-CM

## 2021-03-27 DIAGNOSIS — G4700 Insomnia, unspecified: Secondary | ICD-10-CM | POA: Diagnosis not present

## 2021-03-27 MED ORDER — LORAZEPAM 0.5 MG PO TABS: 0.5000 mg | ORAL_TABLET | Freq: Two times a day (BID) | ORAL | 1 refills | Status: DC | PRN

## 2021-03-27 NOTE — Progress Notes (Signed)
pd   Provider:  Alain Honey, MD  Careteam: Patient Care Team: [name], MD as PCP - General (Family Medicine)  PLACE OF SERVICE:  [place]  Advanced Directive information    Allergies  Allergen Reactions   Lamisil [Terbinafine]    Sulfa Antibiotics     No chief complaint on file.    HPI: Patient is a 80 y.o. male CC: insomnia. Occasionally takes wife's lorazepam that helps.  Goes to sleep, but wakes and unable to go back to sleep for long time. Still takes both BP meds; did not try to leave off to see possible affect on ED. Did not try Viagra either. Scheduled for epidural injection later this month  Review of Systems:  Review of Systems  Musculoskeletal:  Positive for back pain.  Psychiatric/Behavioral:  The patient has insomnia.   All other systems reviewed and are negative.  Past Medical History:  Diagnosis Date   Hypertension    Prostate cancer [facility])    Past Surgical History:  Procedure Laterality Date   NOSE SURGERY  12/12/2019   PROSTATE SURGERY  2005   [name]   Social History:   reports that he has never smoked. He has never used smokeless tobacco. He reports that he does not drink alcohol. No history on file for drug use.  Family History  Problem Relation Age of Onset   Congestive Heart Failure Mother     Medications: Patient's Medications  New Prescriptions   No medications on file  Previous Medications   AMLODIPINE (NORVASC) 5 MG TABLET    TAKE 1 TABLET BY MOUTH EVERY DAY   ATORVASTATIN (LIPITOR) 10 MG TABLET    Take 1 tablet (10 mg total) by mouth daily. OVERDUE TO HAVE CHOLESTEROL CHECKED   CHOLECALCIFEROL (VITAMIN D3 PO)    Take 50 mcg by mouth daily.   LISINOPRIL (ZESTRIL) 20 MG TABLET    TAKE 1 TABLET BY MOUTH EVERY DAY   MAGNESIUM GLYCINATE PO    Take 400 mg by mouth daily.   PREDNISONE (DELTASONE) 20 MG TABLET    Take 1 tablet (20 mg total) by mouth daily with breakfast.   SILDENAFIL (VIAGRA) 100 MG TABLET    Take 1  tablet (100 mg total) by mouth daily as needed for erectile dysfunction.  Modified Medications   No medications on file  Discontinued Medications   No medications on file    Physical Exam:  There were no vitals filed for this visit. There is no height or weight on file to calculate BMI. Wt Readings from Last 3 Encounters:  01/16/21 189 lb (85.7 kg)  12/19/20 190 lb 9.6 oz (86.5 kg)  11/15/20 192 lb (87.1 kg)    Physical Exam Vitals and nursing note reviewed.  Constitutional:      Appearance: Normal appearance.  Cardiovascular:     Rate and Rhythm: Normal rate and regular rhythm.  Pulmonary:     Effort: Pulmonary effort is normal.     Breath sounds: Normal breath sounds.  Neurological:     General: No focal deficit present.     Mental Status: He is alert and oriented to person, place, and time.    Labs reviewed: Basic Metabolic Panel: No results for input(s): NA, K, CL, CO2, GLUCOSE, BUN, CREATININE, CALCIUM, MG, PHOS, TSH in the last 8760 hours. Liver Function Tests: No results for input(s): AST, ALT, ALKPHOS, BILITOT, PROT, ALBUMIN in the last 8760 hours. No results for input(s): LIPASE, AMYLASE in the last  8760 hours. No results for input(s): AMMONIA in the last 8760 hours. CBC: No results for input(s): WBC, NEUTROABS, HGB, HCT, MCV, PLT in the last 8760 hours. Lipid Panel: No results for input(s): CHOL, HDL, LDLCALC, TRIG, CHOLHDL, LDLDIRECT in the last 8760 hours. TSH: No results for input(s): TSH in the last 8760 hours. A1C: Lab Results  Component Value Date   HGBA1C 5.4 12/27/2019     Assessment/Plan  1. Hypercholesterolemia D/C'ed statin last visit. Will check labs and see where lipids are - Lipid Panel; Future  2. Insomnia, unspecified type Take med prn and preferably not q HS - LORazepam (ATIVAN) 0.5 MG tablet; Take 1 tablet (0.5 mg total) by mouth 2 (two) times daily as needed for anxiety.  Dispense: 30 tablet; Refill: 1  3. Chronic right-sided  low back pain with left-sided sciatica For injection this month per history  4. Essential hypertension BP doing great on 2 drug regimen. May be able to omit one or the other at some time   Alain Honey, MD [place] [phone] .

## 2021-03-28 ENCOUNTER — Other Ambulatory Visit: Payer: Medicare HMO

## 2021-03-28 ENCOUNTER — Encounter: Payer: Self-pay | Admitting: Family Medicine

## 2021-03-28 ENCOUNTER — Other Ambulatory Visit: Payer: Self-pay

## 2021-03-28 ENCOUNTER — Other Ambulatory Visit: Payer: Self-pay | Admitting: Family Medicine

## 2021-03-28 DIAGNOSIS — E7800 Pure hypercholesterolemia, unspecified: Secondary | ICD-10-CM | POA: Diagnosis not present

## 2021-03-28 LAB — LIPID PANEL
Cholesterol: 211 mg/dL — ABNORMAL HIGH (ref <200)
HDL: 55 mg/dL (ref >=40)
LDL Cholesterol (Calc): 134 mg/dL (calc) — ABNORMAL HIGH
Non-HDL Cholesterol (Calc): 156 mg/dL (calc) — ABNORMAL HIGH (ref <130)
Total CHOL/HDL Ratio: 3.8 (calc) (ref <5.0)
Triglycerides: 112 mg/dL (ref <150)

## 2021-04-01 ENCOUNTER — Other Ambulatory Visit: Payer: Self-pay

## 2021-04-01 DIAGNOSIS — E7800 Pure hypercholesterolemia, unspecified: Secondary | ICD-10-CM

## 2021-04-01 MED ORDER — ATORVASTATIN CALCIUM 10 MG PO TABS: 10.0000 mg | ORAL_TABLET | Freq: Every day | ORAL | 3 refills | Status: DC

## 2021-04-16 DIAGNOSIS — M1611 Unilateral primary osteoarthritis, right hip: Secondary | ICD-10-CM | POA: Insufficient documentation

## 2021-04-16 DIAGNOSIS — M5126 Other intervertebral disc displacement, lumbar region: Secondary | ICD-10-CM | POA: Diagnosis not present

## 2021-04-16 DIAGNOSIS — M5136 Other intervertebral disc degeneration, lumbar region: Secondary | ICD-10-CM | POA: Diagnosis not present

## 2021-04-16 DIAGNOSIS — M25559 Pain in unspecified hip: Secondary | ICD-10-CM | POA: Diagnosis not present

## 2021-04-16 DIAGNOSIS — I10 Essential (primary) hypertension: Secondary | ICD-10-CM | POA: Diagnosis not present

## 2021-04-16 DIAGNOSIS — M47816 Spondylosis without myelopathy or radiculopathy, lumbar region: Secondary | ICD-10-CM | POA: Diagnosis not present

## 2021-05-23 DIAGNOSIS — M1611 Unilateral primary osteoarthritis, right hip: Secondary | ICD-10-CM | POA: Diagnosis not present

## 2021-05-23 DIAGNOSIS — M47816 Spondylosis without myelopathy or radiculopathy, lumbar region: Secondary | ICD-10-CM | POA: Diagnosis not present

## 2021-05-23 DIAGNOSIS — M5136 Other intervertebral disc degeneration, lumbar region: Secondary | ICD-10-CM | POA: Diagnosis not present

## 2021-05-31 ENCOUNTER — Other Ambulatory Visit: Payer: Self-pay | Admitting: Family Medicine

## 2021-06-18 ENCOUNTER — Other Ambulatory Visit: Payer: Self-pay

## 2021-06-18 DIAGNOSIS — E7800 Pure hypercholesterolemia, unspecified: Secondary | ICD-10-CM

## 2021-06-19 ENCOUNTER — Encounter: Payer: Self-pay | Admitting: Family Medicine

## 2021-06-19 ENCOUNTER — Other Ambulatory Visit: Payer: Self-pay

## 2021-06-19 ENCOUNTER — Non-Acute Institutional Stay: Payer: Medicare HMO | Admitting: Family Medicine

## 2021-06-19 VITALS — BP 136/82 | HR 82 | Temp 98.2°F | Ht 66.0 in | Wt 199.0 lb

## 2021-06-19 DIAGNOSIS — I10 Essential (primary) hypertension: Secondary | ICD-10-CM | POA: Diagnosis not present

## 2021-06-19 DIAGNOSIS — M5136 Other intervertebral disc degeneration, lumbar region: Secondary | ICD-10-CM | POA: Diagnosis not present

## 2021-06-19 LAB — LIPID PANEL
Cholesterol: 142 mg/dL (ref <200)
HDL: 50 mg/dL (ref >=40)
LDL Cholesterol (Calc): 72 mg/dL (calc)
Non-HDL Cholesterol (Calc): 92 mg/dL (calc) (ref <130)
Total CHOL/HDL Ratio: 2.8 (calc) (ref <5.0)
Triglycerides: 115 mg/dL (ref <150)

## 2021-06-21 NOTE — Progress Notes (Signed)
Provider:  Alain Honey, MD  Careteam: Patient Care Team: [name], MD as PCP - General (Family Medicine)  PLACE OF SERVICE:  [place] Directive information    Allergies  Allergen Reactions   Lamisil [Terbinafine]    Sulfa Antibiotics     Chief Complaint  Patient presents with   Medical Management of Chronic Issues    Patient presents today for a follow-up appointment and to go over lab work.     HPI: Patient is a 80 y.o. male.  Patient returns for follow-up of lipids and sleep disorder.  We had temporarily held the statin but his cholesterol numbers went up and statin was added back.  Yesterday lipids were repeated and things look much better.  LDL cholesterol is 72 on 10 mg of atorvastatin. He was given some Ativan to use as needed for sleep.  He reports that when he takes Ativan he sleeps well.  He understands that it is not meant to be taken every night. He also has been having back pain.  That has resolved for now.  There was an awkward movement in which he slipped and back pain has resolved.  I think this is probably not the last we have heard of this pain though.  Review of Systems:  Review of Systems  All other systems reviewed and are negative.  Past Medical History:  Diagnosis Date   Hypertension    Prostate cancer [facility])    Past Surgical History:  Procedure Laterality Date   NOSE SURGERY  12/12/2019   PROSTATE SURGERY  2005   [name]   Social History:   reports that he has never smoked. He has never used smokeless tobacco. He reports that he does not drink alcohol. No history on file for drug use.  Family History  Problem Relation Age of Onset   Congestive Heart Failure Mother     Medications: Patient's Medications  New Prescriptions   No medications on file  Previous Medications   AMLODIPINE (NORVASC) 5 MG TABLET    TAKE 1 TABLET BY MOUTH EVERY DAY   ATORVASTATIN (LIPITOR) 10 MG TABLET    Take 1 tablet (10 mg  total) by mouth daily.   CHOLECALCIFEROL (VITAMIN D3 PO)    Take 50 mcg by mouth daily.   LISINOPRIL (ZESTRIL) 20 MG TABLET    TAKE 1 TABLET BY MOUTH EVERY DAY   LORAZEPAM (ATIVAN) 0.5 MG TABLET    Take 1 tablet (0.5 mg total) by mouth 2 (two) times daily as needed for anxiety.   MAGNESIUM GLYCINATE PO    Take 400 mg by mouth daily.  Modified Medications   No medications on file  Discontinued Medications   No medications on file    Physical Exam:  Vitals:   06/19/21 1617  BP: 136/82  Pulse: 82  Temp: 98.2 F (36.8 C)  SpO2: 98%  Weight: 199 lb (90.3 kg)  Height: 5\' 6"  (1.676 m)   Body mass index is 32.12 kg/m. Wt Readings from Last 3 Encounters:  06/19/21 199 lb (90.3 kg)  03/27/21 191 lb 6.4 oz (86.8 kg)  01/16/21 189 lb (85.7 kg)    Physical Exam Vitals and nursing note reviewed.  Constitutional:      Appearance: Normal appearance.  Cardiovascular:     Rate and Rhythm: Normal rate and regular rhythm.  Pulmonary:     Effort: Pulmonary effort is normal.     Breath sounds: Normal breath sounds.  Neurological:  General: No focal deficit present.     Mental Status: He is alert and oriented to person, place, and time.    Labs reviewed: Basic Metabolic Panel: No results for input(s): NA, K, CL, CO2, GLUCOSE, BUN, CREATININE, CALCIUM, MG, PHOS, TSH in the last 8760 hours. Liver Function Tests: No results for input(s): AST, ALT, ALKPHOS, BILITOT, PROT, ALBUMIN in the last 8760 hours. No results for input(s): LIPASE, AMYLASE in the last 8760 hours. No results for input(s): AMMONIA in the last 8760 hours. CBC: No results for input(s): WBC, NEUTROABS, HGB, HCT, MCV, PLT in the last 8760 hours. Lipid Panel: Recent Labs    03/28/21 0720 06/18/21 0715  CHOL 211* 142  HDL 55 50  LDLCALC 134* 72  TRIG 112 115  CHOLHDL 3.8 2.8   TSH: No results for input(s): TSH in the last 8760 hours. A1C: Lab Results  Component Value Date   HGBA1C 5.4 12/27/2019      Assessment/Plan   1. Essential hypertension Blood pressure of 136/82 today.  Continues on lisinopril and amlodipine   2. Lumbar degenerative disc disease No pain at present.  Will follow  Alain Honey, MD [facility] [phone]

## 2021-08-01 ENCOUNTER — Telehealth (INDEPENDENT_AMBULATORY_CARE_PROVIDER_SITE_OTHER): Payer: Medicare HMO | Admitting: Orthopedic Surgery

## 2021-08-01 ENCOUNTER — Other Ambulatory Visit: Payer: Self-pay

## 2021-08-01 ENCOUNTER — Encounter: Payer: Self-pay | Admitting: Orthopedic Surgery

## 2021-08-01 DIAGNOSIS — U071 COVID-19: Secondary | ICD-10-CM

## 2021-08-01 NOTE — Patient Instructions (Signed)
-   recommend rest and hydration  - recommend vitamin C 1000 mg daily x 10 days - recommend vitamin D 2000 units daily x 10 days - recommend Zinc 50 mg daily x 10 days - please follow CDC guidelines and wears mask for 10 days - if your symptoms become worse or do not resolve contact provider

## 2021-08-01 NOTE — Progress Notes (Signed)
This service is provided via telemedicine  No vital signs collected/recorded due to the encounter was a telemedicine visit.   Location of patient (ex: home, work):  Home  Patient consents to a telephone visit:  Yes  Location of the provider (ex: office, home):  Office  Name of any referring provider:  [name], MD   Names of all persons participating in the telemedicine service and their role in the encounter:  Shary Key (patient), Neev Mcmains (wife), Casimer Leek, CMA and Windell Moulding, NP  Time spent on call:  8 minutes

## 2021-08-01 NOTE — Progress Notes (Signed)
Careteam: Patient Care Team: [name], MD as PCP - General (Family Medicine)  Seen by: Windell Moulding, AGNP-C  PLACE OF SERVICE:  [place] Directive information    Allergies  Allergen Reactions   Lamisil [Terbinafine]    Sulfa Antibiotics     Chief Complaint  Patient presents with   Acute Visit    Patient present virtually for upper respiratory infection symptoms since yesterday,07/31/21. He reports body aches, sore throat, cough,congestion, runny nose. Tested for COIVD on today,08/01/21 and results was positive. He reports known COVID exposure on Sunday,07/28/21.      HPI: Patient is a 80 y.o. male seen today for acute visit via telephone due to positive covid test.   12/11 he was with people who ended up testing positive for covid. 12/14 he began to have symptoms of fever, body aches, sore throat, cough and congestion.  Fever was 99.0. He did not take anything for fever. Cough dry, non productive. Today, he tested positive for covid. He is trying to rest and drink fluids. He started taking Coricidin for symptoms yesterday. Treatment options discussed. He is not interested in Paxlovid at this time. Denies chest pain, shortness of breath, dizziness or headaches.   Review of Systems:  Review of Systems  Constitutional:  Positive for chills, fever and malaise/fatigue. Negative for weight loss.  HENT:  Positive for congestion and sore throat. Negative for sinus pain.   Respiratory:  Positive for cough. Negative for sputum production, shortness of breath and wheezing.   Cardiovascular:  Negative for chest pain and leg swelling.  Gastrointestinal:  Negative for diarrhea, nausea and vomiting.  Musculoskeletal:  Negative for myalgias.  Skin:  Negative for itching and rash.  Neurological:  Negative for dizziness and headaches.  Psychiatric/Behavioral:  Negative for depression. The patient is not nervous/anxious.    Past Medical History:  Diagnosis Date    Hypertension    Prostate cancer [facility])    Past Surgical History:  Procedure Laterality Date   NOSE SURGERY  12/12/2019   PROSTATE SURGERY  2005   [name]   Social History:   reports that he has never smoked. He has never used smokeless tobacco. He reports that he does not drink alcohol. No history on file for drug use.  Family History  Problem Relation Age of Onset   Congestive Heart Failure Mother     Medications: Patient's Medications  New Prescriptions   No medications on file  Previous Medications   AMLODIPINE (NORVASC) 5 MG TABLET    TAKE 1 TABLET BY MOUTH EVERY DAY   ATORVASTATIN (LIPITOR) 10 MG TABLET    Take 1 tablet (10 mg total) by mouth daily.   CHOLECALCIFEROL (VITAMIN D3 PO)    Take 50 mcg by mouth daily.   LISINOPRIL (ZESTRIL) 20 MG TABLET    TAKE 1 TABLET BY MOUTH EVERY DAY   LORAZEPAM (ATIVAN) 0.5 MG TABLET    Take 1 tablet (0.5 mg total) by mouth 2 (two) times daily as needed for anxiety.   MAGNESIUM GLYCINATE PO    Take 400 mg by mouth daily.  Modified Medications   No medications on file  Discontinued Medications   No medications on file    Physical Exam:  There were no vitals filed for this visit. There is no height or weight on file to calculate BMI. Wt Readings from Last 3 Encounters:  06/19/21 199 lb (90.3 kg)  03/27/21 191 lb 6.4 oz (86.8 kg)  01/16/21  189 lb (85.7 kg)    Physical Exam unable to perform due to telephone visit  Labs reviewed: Basic Metabolic Panel: No results for input(s): NA, K, CL, CO2, GLUCOSE, BUN, CREATININE, CALCIUM, MG, PHOS, TSH in the last 8760 hours. Liver Function Tests: No results for input(s): AST, ALT, ALKPHOS, BILITOT, PROT, ALBUMIN in the last 8760 hours. No results for input(s): LIPASE, AMYLASE in the last 8760 hours. No results for input(s): AMMONIA in the last 8760 hours. CBC: No results for input(s): WBC, NEUTROABS, HGB, HCT, MCV, PLT in the last 8760 hours. Lipid Panel: Recent Labs     03/28/21 0720 06/18/21 0715  CHOL 211* 142  HDL 55 50  LDLCALC 134* 72  TRIG 112 115  CHOLHDL 3.8 2.8   TSH: No results for input(s): TSH in the last 8760 hours. A1C: Lab Results  Component Value Date   HGBA1C 5.4 12/27/2019     Assessment/Plan 1. COVID - positive home test 12/15 - symptoms: fever, malaise, body aches, malaise, sore throat, dry cough - recommend rest and hydration  - recommend starting vitamin C 1000 mg daily x 10 days - recommend vitamin D 2000 units daily x 10 days - recommend Zinc 50 mg daily x 10 days - please follow CDC guidelines and wears mask for 10 days - if your symptoms become worse or do not resolve contact provider  Telephone Note   I connected with Patrick [name] by telephone and verified that I am speaking with the correct person using two identifiers.   Patient: Patrick [name] Patient location: home Provider: Windell Moulding NP Provider location: [facility]    I discussed the limitations, risks, security and privacy concerns of performing an evaluation and management service by telephone and the availability of in person appointments. I also discussed with the patient that there may be a patient responsible charge related to this service. The patient expressed understanding and agreed to proceed.    I discussed the assessment and treatment plan with the patient. The patient was provided an opportunity to ask questions and all were answered. The patient agreed with the plan and demonstrated an understanding of the instructions.     The patient was advised to call back or seek an in-person evaluation if the symptoms worsen or if the condition fails to improve as anticipated.   I provided 11 minutes of non-face-to-face time during this encounter.   Josselin Gaulin Cleophas Dunker, [place] printed and mailed    Next appt: none Morganne Haile [place], [facility] [phone]

## 2021-08-07 ENCOUNTER — Telehealth: Payer: Self-pay | Admitting: *Deleted

## 2021-08-07 NOTE — Telephone Encounter (Signed)
Patient called and stated that he in on 5th Day of being COVID Positive.  Stated that he has a Cough, Sore Throat, Weakness. No Fever.   Patient is requesting something to be called into pharmacy to help with his symptoms.   Pharmacy: [facility]  Please Advise.

## 2021-08-08 NOTE — Telephone Encounter (Signed)
Dr. Sabra Heck stated that patient is past his time for taking PaxLovid.   I called patient to check on him and he stated that he was doing better. Patient Patrick [name] call back if worsens or anything changes.

## 2021-08-13 DIAGNOSIS — M5136 Other intervertebral disc degeneration, lumbar region: Secondary | ICD-10-CM | POA: Diagnosis not present

## 2021-08-13 DIAGNOSIS — M5416 Radiculopathy, lumbar region: Secondary | ICD-10-CM | POA: Diagnosis not present

## 2021-08-13 DIAGNOSIS — M47816 Spondylosis without myelopathy or radiculopathy, lumbar region: Secondary | ICD-10-CM | POA: Diagnosis not present

## 2021-08-13 DIAGNOSIS — M5126 Other intervertebral disc displacement, lumbar region: Secondary | ICD-10-CM | POA: Diagnosis not present

## 2021-08-21 DIAGNOSIS — M5126 Other intervertebral disc displacement, lumbar region: Secondary | ICD-10-CM | POA: Diagnosis not present

## 2021-08-21 DIAGNOSIS — M5116 Intervertebral disc disorders with radiculopathy, lumbar region: Secondary | ICD-10-CM | POA: Diagnosis not present

## 2021-08-21 DIAGNOSIS — M48061 Spinal stenosis, lumbar region without neurogenic claudication: Secondary | ICD-10-CM | POA: Diagnosis not present

## 2021-08-21 DIAGNOSIS — M4726 Other spondylosis with radiculopathy, lumbar region: Secondary | ICD-10-CM | POA: Diagnosis not present

## 2021-08-28 ENCOUNTER — Ambulatory Visit: Payer: Medicare HMO | Admitting: Family Medicine

## 2021-09-14 ENCOUNTER — Other Ambulatory Visit: Payer: Self-pay | Admitting: Family Medicine

## 2021-09-25 DIAGNOSIS — M5116 Intervertebral disc disorders with radiculopathy, lumbar region: Secondary | ICD-10-CM | POA: Diagnosis not present

## 2021-09-25 DIAGNOSIS — M48061 Spinal stenosis, lumbar region without neurogenic claudication: Secondary | ICD-10-CM | POA: Diagnosis not present

## 2021-09-25 DIAGNOSIS — I10 Essential (primary) hypertension: Secondary | ICD-10-CM | POA: Diagnosis not present

## 2021-09-26 ENCOUNTER — Other Ambulatory Visit: Payer: Self-pay | Admitting: Family Medicine

## 2021-09-26 DIAGNOSIS — G4700 Insomnia, unspecified: Secondary | ICD-10-CM

## 2021-09-26 NOTE — Telephone Encounter (Signed)
Request pt to make appt prior to refill. Will need contract on file if Dr Sabra Heck wishes to continue.

## 2021-09-26 NOTE — Telephone Encounter (Signed)
Noted  

## 2021-09-26 NOTE — Telephone Encounter (Signed)
Patient has request refill on medication "Lorazepam 0.5mg ". Patient last refill dated 03/27/2021. Patient has NO [ Non Opioid Contract ] on file. Patient also has no upcoming appointments. Patient last visit was with PCP [name], MD dated 03/27/2021. Medication pend and sent to Sherrie Mustache, NP for approval or denial.

## 2021-10-01 DIAGNOSIS — L820 Inflamed seborrheic keratosis: Secondary | ICD-10-CM | POA: Diagnosis not present

## 2021-10-01 DIAGNOSIS — L814 Other melanin hyperpigmentation: Secondary | ICD-10-CM | POA: Diagnosis not present

## 2021-10-01 DIAGNOSIS — L570 Actinic keratosis: Secondary | ICD-10-CM | POA: Diagnosis not present

## 2021-10-01 DIAGNOSIS — L821 Other seborrheic keratosis: Secondary | ICD-10-CM | POA: Diagnosis not present

## 2021-10-01 DIAGNOSIS — L579 Skin changes due to chronic exposure to nonionizing radiation, unspecified: Secondary | ICD-10-CM | POA: Diagnosis not present

## 2021-10-01 DIAGNOSIS — D1801 Hemangioma of skin and subcutaneous tissue: Secondary | ICD-10-CM | POA: Diagnosis not present

## 2021-10-01 DIAGNOSIS — Z85828 Personal history of other malignant neoplasm of skin: Secondary | ICD-10-CM | POA: Diagnosis not present

## 2021-10-22 ENCOUNTER — Other Ambulatory Visit: Payer: Self-pay | Admitting: Family Medicine

## 2021-10-22 DIAGNOSIS — G4700 Insomnia, unspecified: Secondary | ICD-10-CM

## 2021-10-22 NOTE — Telephone Encounter (Signed)
Rx last refill 03/27/21, 30/1 refill  ? ?No treatment agreement on file nor upcoming appointment.  ? ?Last appointment was on 08/01/2021 ?

## 2021-10-24 ENCOUNTER — Ambulatory Visit: Payer: Medicare HMO | Admitting: Nurse Practitioner

## 2021-10-24 ENCOUNTER — Other Ambulatory Visit: Payer: Self-pay

## 2021-10-24 VITALS — BP 140/82 | HR 82 | Temp 98.2°F | Ht 66.0 in | Wt 195.4 lb

## 2021-10-24 DIAGNOSIS — Z0000 Encounter for general adult medical examination without abnormal findings: Secondary | ICD-10-CM | POA: Diagnosis not present

## 2021-10-24 NOTE — Progress Notes (Signed)
Subjective:   Patrick [name] is a 81 y.o. male who presents for Medicare Annual/Subsequent preventive examination in [facility].  Cardiac Risk Factors include: advanced age (>54mn, >>60women);hypertension;male gender;obesity (BMI >30kg/m2)     Objective:    Today's Vitals   10/24/21 1613 10/24/21 1623  BP:  140/82  Pulse:  82  Temp:  98.2 F (36.8 C)  SpO2:  95%  Weight:  195 lb 6.4 oz (88.6 kg)  Height:  '5\' 6"'$  (1.676 m)  PainSc: 2     Body mass index is 31.54 kg/m.  Advanced Directives 10/24/2021 12/19/2020 06/04/2020  Does Patient Have a Medical Advance Directive? Yes Yes Yes  Type of AParamedicof A[place]ardLiving will Living will;Healthcare Power of Attorney  Does patient want to make changes to medical advance directive? No - Patient declined No - Patient declined No - Patient declined  Copy of HEricsonin Chart? No - copy requested No - copy requested -    Current Medications (verified) Outpatient Encounter Medications as of 10/24/2021  Medication Sig   amLODipine (NORVASC) 5 MG tablet TAKE 1 TABLET BY MOUTH EVERY DAY   atorvastatin (LIPITOR) 10 MG tablet Take 1 tablet (10 mg total) by mouth daily.   Cholecalciferol (VITAMIN D3 PO) Take 50 mcg by mouth daily.   lisinopril (ZESTRIL) 20 MG tablet TAKE 1 TABLET BY MOUTH EVERY DAY   MAGNESIUM GLYCINATE PO Take 400 mg by mouth daily.   [DISCONTINUED] LORazepam (ATIVAN) 0.5 MG tablet TAKE 1 TABLET BY MOUTH 2 TIMES DAILY AS NEEDED FOR ANXIETY.   No facility-administered encounter medications on file as of 10/24/2021.    Allergies (verified) Lamisil [terbinafine] and Sulfa antibiotics   History: Past Medical History:  Diagnosis Date   Hypertension    Prostate cancer ([facility]    Past Surgical History:  Procedure Laterality Date   NOSE SURGERY  12/12/2019   PROSTATE SURGERY  2005   RRolanda Lundborg  Family History  Problem  Relation Age of Onset   Congestive Heart Failure Mother    Social History   Socioeconomic History   Marital status: Married    Spouse name: Not on file   Number of children: Not on file   Years of education: Not on file   Highest education level: Not on file  Occupational History   Not on file  Tobacco Use   Smoking status: Never   Smokeless tobacco: Never  Vaping Use   Vaping Use: Never used  Substance and Sexual Activity   Alcohol use: Never   Drug use: Not on file   Sexual activity: Not on file  Other Topics Concern   Not on file  Social History Narrative   Social History      Diet? n/a      Do you drink/eat things with caffeine? yes      Marital status?  Married                                  What year were you married? 1965      Do you live in a house, apartment, assisted living, condo, trailer, etc.? [place]      Is it one or more stories? 4      How many persons live in your home? 2      Do you  have any pets in your home? (please list) no      Highest level of education completed? Masters      Current or past profession: clergy       Do you exercise?                   yes                   Type & how often? Walk weights      Advanced Directives      Do you have a living will?yes       Do you have a DNR form?                                  If not, do you want to discuss one? yes      Do you have signed POA/HPOA for forms? yes      Functional Status      Do you have difficulty bathing or dressing yourself? no      Do you have difficulty preparing food or eating? no      Do you have difficulty managing your medications?no      Do you have difficulty managing your finances?no      Do you have difficulty affording your medications?no      Social Determinants of Health   Financial Resource Strain: Not on file  Food Insecurity: Not on file  Transportation Needs: Not on file  Physical Activity: Not on file  Stress: Not on  file  Social Connections: Not on file    Tobacco Counseling Counseling given: Not Answered   Clinical Intake:  Pre-visit preparation completed: Yes  Pain : 0-10 Pain Score: 2  Pain Location: Hip Pain Orientation: Right Pain Radiating Towards: no Pain Descriptors / Indicators: Dull, Constant Pain Onset: More than a month ago Pain Frequency: Constant Pain Relieving Factors: no Effect of Pain on Daily Activities: none  Pain Relieving Factors: no  BMI - recorded: 31.54 Nutritional Status: BMI > 30  Obese Nutritional Risks: None Diabetes: No  How often do you need to have someone help you when you read instructions, pamphlets, or other written materials from your doctor or pharmacy?: 1 - Never What is the last grade level you completed in school?: master degree  Diabetic?no  Interpreter Needed?: No  Information entered by :: Kewan Mcnease Bretta Bang NP   Activities of Daily Living In your present state of health, do you have any difficulty performing the following activities: 10/24/2021  Hearing? N  Vision? N  Difficulty concentrating or making decisions? N  Walking or climbing stairs? N  Dressing or bathing? N  Doing errands, shopping? N  Preparing Food and eating ? N  Using the Toilet? N  In the past six months, have you accidently leaked urine? Y  Comment small amount, occasionally  Do you have problems with loss of bowel control? N  Managing your Medications? N  Managing your Finances? N  Housekeeping or managing your Housekeeping? N  Some recent data might be hidden    Patient Care Team: [name], MD as PCP - General (Family Medicine)  Indicate any recent Medical Services you may have received from other than [facility] providers in the past year (date may be approximate).     Assessment:   This is a routine wellness examination for Patrick [name].  Hearing/Vision screen No results found.  Dietary issues and exercise activities discussed: Current Exercise Habits:  Home exercise routine, Type of exercise: walking;treadmill;stretching;strength training/weights, Time (Minutes): 45, Frequency (Times/Week): 3, Weekly Exercise (Minutes/Week): 135, Intensity: Moderate, Exercise limited by: orthopedic condition(s)   Goals Addressed             This Visit's Progress    Increase physical activity       Decrease oral caloric intake to possibly reduce weight.        Depression Screen PHQ 2/9 Scores 10/24/2021 06/21/2020  PHQ - 2 Score 0 0  PHQ- 9 Score 3 1    Fall Risk Fall Risk  10/24/2021 03/27/2021 12/19/2020 12/30/2019 07/08/2019  Falls in the past year? 0 0 0 0 0  Number falls in past yr: 0 0 0 0 0  Injury with Fall? 0 0 0 - -  Risk for fall due to : No Fall Risks No Fall Risks - - -  Follow up Falls evaluation completed Falls evaluation completed;Education provided;Falls prevention discussed - - -    FALL RISK PREVENTION PERTAINING TO THE HOME:  Any stairs in or around the home? Yes  If so, are there any without handrails? No  Home free of loose throw rugs in walkways, pet beds, electrical cords, etc? Yes  Adequate lighting in your home to reduce risk of falls? Yes   ASSISTIVE DEVICES UTILIZED TO PREVENT FALLS:  Life alert? No  Use of a cane, walker or w/c? No  Grab bars in the bathroom? Yes  Shower chair or bench in shower? No  Elevated toilet seat or a handicapped toilet? No   TIMED UP AND GO:  Was the test performed? No .    Gait steady and fast without use of assistive device  Cognitive Function: MMSE - Mini Mental State Exam 10/24/2021 06/21/2020  Orientation to time 5 5  Orientation to Place 5 5  Registration 3 3  Attention/ Calculation 1 1  Recall 3 3  Language- name 2 objects 2 2  Language- repeat 1 1  Language- follow 3 step command 3 3  Language- read & follow direction 1 1  Write a sentence 1 1  Copy design 1 1  Total score 26 26        Immunizations Immunization History  Administered Date(s) Administered    Hepatitis A 06/26/2000   Influenza, High Dose Seasonal PF 05/27/2016, 05/08/2020   Influenza-Unspecified 08/09/2008, 05/18/2021   Moderna SARS-COV2 Booster Vaccination 12/17/2020   Moderna Sars-Covid-2 Vaccination 08/22/2019, 09/19/2019, 06/26/2020   PFIZER(Purple Top)SARS-COV-2 Vaccination 05/09/2021   Pneumococcal Conjugate-13 11/23/2015   Pneumococcal Polysaccharide-23 04/03/2017   Tdap 08/18/2001, 12/12/2013   Zoster Recombinat (Shingrix) 11/03/2017, 01/18/2018   Zoster, Live 08/18/2004    TDAP status: Due, Education has been provided regarding the importance of this vaccine. Advised may receive this vaccine at local pharmacy or Health Dept. Aware to provide a copy of the vaccination record if obtained from local pharmacy or Health Dept. Verbalized acceptance and understanding.  Flu Vaccine status: Up to date  Pneumococcal vaccine status: Up to date  Covid-19 vaccine status: Information provided on how to obtain vaccines.   Qualifies for Shingles Vaccine? Yes   Zostavax completed Yes   Shingrix Completed?: Yes  Screening Tests Health Maintenance  Topic Date Due   Hepatitis C Screening  Never done   COVID-19 Vaccine (5 - Booster for Moderna series) 07/09/2022 (Originally 07/04/2021)   TETANUS/TDAP  12/13/2023   Pneumonia Vaccine 88+ Years old  Completed   INFLUENZA  VACCINE  Completed   Zoster Vaccines- Shingrix  Completed   HPV VACCINES  Aged Out    Health Maintenance  Health Maintenance Due  Topic Date Due   Hepatitis C Screening  Never done    Colorectal cancer screening: No longer required.   Lung Cancer Screening: (Low Dose CT Chest recommended if Age 18-80 years, 30 pack-year currently smoking OR have quit w/in 15years.) does not qualify.    Additional Screening:  Hepatitis C Screening: does not qualify  Vision Screening: Recommended annual ophthalmology exams for early detection of glaucoma and other disorders of the eye. Is the patient up to date with  their annual eye exam?  Yes  Who is the provider or what is the name of the office in which the patient attends annual eye exams? Dr. Amalia Hailey. If pt is not established with a provider, would they like to be referred to a provider to establish care? No .   Dental Screening: Recommended annual dental exams for proper oral hygiene  Community Resource Referral / Chronic Care Management: CRR required this visit?  No   CCM required this visit?  No      Plan:     I have personally reviewed and noted the following in the patients chart:   Medical and social history Use of alcohol, tobacco or illicit drugs  Current medications and supplements including opioid prescriptions. Patient is not currently taking opioid prescriptions. Functional ability and status Nutritional status Physical activity Advanced directives List of other physicians Hospitalizations, surgeries, and ER visits in previous 12 months Vitals Screenings to include cognitive, depression, and falls Referrals and appointments  In addition, I have reviewed and discussed with patient certain preventive protocols, quality metrics, and best practice recommendations. A written personalized care plan for preventive services as well as general preventive health recommendations were provided to patient.     Vikki Gains X Patrick Nies, NP   10/26/2021

## 2021-10-28 DIAGNOSIS — M47816 Spondylosis without myelopathy or radiculopathy, lumbar region: Secondary | ICD-10-CM | POA: Diagnosis not present

## 2021-10-28 DIAGNOSIS — M5416 Radiculopathy, lumbar region: Secondary | ICD-10-CM | POA: Diagnosis not present

## 2021-10-28 DIAGNOSIS — M5136 Other intervertebral disc degeneration, lumbar region: Secondary | ICD-10-CM | POA: Diagnosis not present

## 2021-11-16 DIAGNOSIS — G4733 Obstructive sleep apnea (adult) (pediatric): Secondary | ICD-10-CM | POA: Diagnosis not present

## 2021-11-16 DIAGNOSIS — Z008 Encounter for other general examination: Secondary | ICD-10-CM | POA: Diagnosis not present

## 2021-11-16 DIAGNOSIS — I10 Essential (primary) hypertension: Secondary | ICD-10-CM | POA: Diagnosis not present

## 2021-11-16 DIAGNOSIS — Z85828 Personal history of other malignant neoplasm of skin: Secondary | ICD-10-CM | POA: Diagnosis not present

## 2021-11-16 DIAGNOSIS — Z8249 Family history of ischemic heart disease and other diseases of the circulatory system: Secondary | ICD-10-CM | POA: Diagnosis not present

## 2021-11-16 DIAGNOSIS — M545 Low back pain, unspecified: Secondary | ICD-10-CM | POA: Diagnosis not present

## 2021-11-16 DIAGNOSIS — E669 Obesity, unspecified: Secondary | ICD-10-CM | POA: Diagnosis not present

## 2021-11-16 DIAGNOSIS — M1990 Unspecified osteoarthritis, unspecified site: Secondary | ICD-10-CM | POA: Diagnosis not present

## 2021-11-16 DIAGNOSIS — Z8546 Personal history of malignant neoplasm of prostate: Secondary | ICD-10-CM | POA: Diagnosis not present

## 2021-11-16 DIAGNOSIS — Z6831 Body mass index (BMI) 31.0-31.9, adult: Secondary | ICD-10-CM | POA: Diagnosis not present

## 2021-11-16 DIAGNOSIS — G8929 Other chronic pain: Secondary | ICD-10-CM | POA: Diagnosis not present

## 2021-11-16 DIAGNOSIS — R69 Illness, unspecified: Secondary | ICD-10-CM | POA: Diagnosis not present

## 2021-11-16 DIAGNOSIS — E785 Hyperlipidemia, unspecified: Secondary | ICD-10-CM | POA: Diagnosis not present

## 2021-12-02 DIAGNOSIS — L821 Other seborrheic keratosis: Secondary | ICD-10-CM | POA: Diagnosis not present

## 2021-12-02 DIAGNOSIS — L814 Other melanin hyperpigmentation: Secondary | ICD-10-CM | POA: Diagnosis not present

## 2021-12-02 DIAGNOSIS — Z85828 Personal history of other malignant neoplasm of skin: Secondary | ICD-10-CM | POA: Diagnosis not present

## 2021-12-02 DIAGNOSIS — L570 Actinic keratosis: Secondary | ICD-10-CM | POA: Diagnosis not present

## 2021-12-02 DIAGNOSIS — B079 Viral wart, unspecified: Secondary | ICD-10-CM | POA: Diagnosis not present

## 2021-12-02 DIAGNOSIS — D485 Neoplasm of uncertain behavior of skin: Secondary | ICD-10-CM | POA: Diagnosis not present

## 2022-01-06 DIAGNOSIS — Z23 Encounter for immunization: Secondary | ICD-10-CM | POA: Diagnosis not present

## 2022-03-12 IMAGING — XA Imaging study
2 series · 2 of 2 positions shown · non-contrast
Comparison: none

CLINICAL DATA: Lumbosacral spondylosis without myelopathy with
radiculopathy. Right-sided low back, buttock, and lateral leg pain.
Disc protrusion at L4-L5 affecting the descending right L5 nerve
root. Good response to prior right L5 transforaminal epidural
injections at outside facilities.

[Series 1: ortho standard · 1 of 1 slices shown (1 of 2)]
[im 1/1]
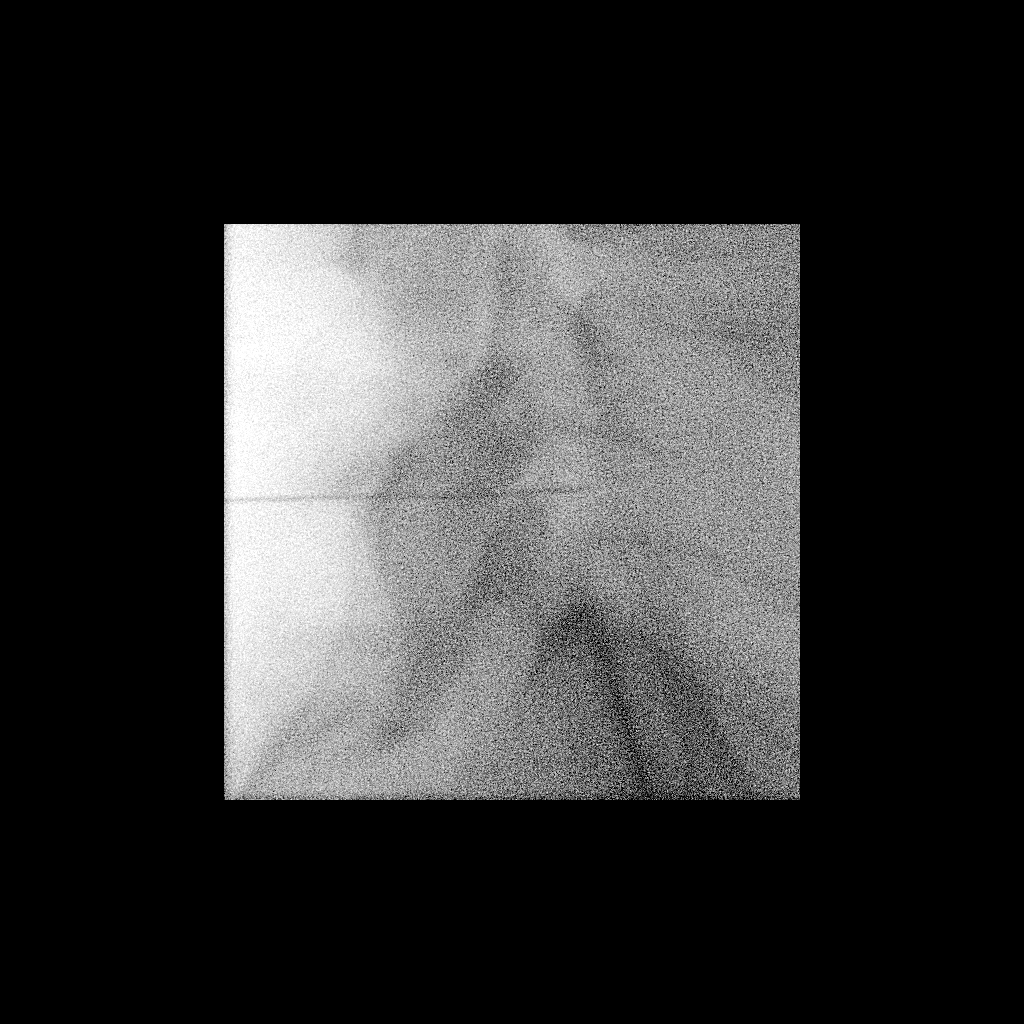

[Series 2: ortho standard · 1 of 1 slices shown (2 of 2)]
[im 1/1]
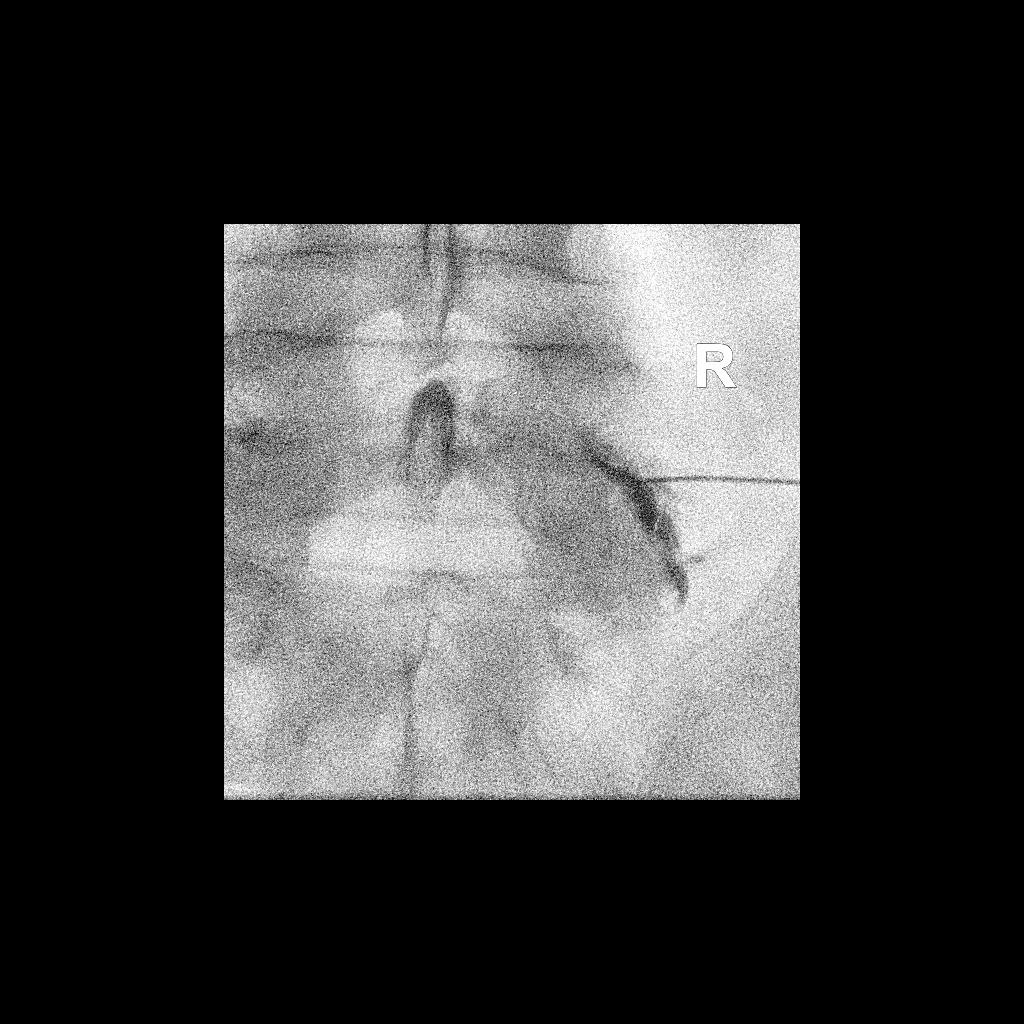

[2 of 2 positions shown; findings below may reference images not displayed]

EXAM:
DG INJECT/KRUSOVICE INC NEEDLE/KUNTZ/NASIR EPI/SAIZO/SAC W/IMG

FLUOROSCOPY TIME:  Radiation Exposure Index (as provided by the
fluoroscopic device): 1.9 mGy

Fluoroscopy Time:  18 seconds

Number of Acquired Images:  0

PROCEDURE:
The procedure, risks, benefits, and alternatives were explained to
the patient. Questions regarding the procedure were encouraged and
answered. The patient understands and consents to the procedure.

RIGHT L5 NERVE ROOT BLOCK AND TRANSFORAMINAL EPIDURAL: A posterior
oblique approach was taken to the intervertebral foramen on the
right at L5-S1 using a curved 5 inch 22 gauge spinal needle.
Injection of Isovue M 200 outlined the right L5 nerve root and
showed good epidural spread. No vascular opacification is seen. 80
mg of Depo-Medrol mixed with 2 mL of 1% lidocaine were instilled.
The procedure was well-tolerated, and the patient was discharged
thirty minutes following the injection in good condition.

COMPLICATIONS:
None immediate.
IMPRESSION: Technically successful injection consisting of a right L5 nerve root
block and transforaminal epidural.

## 2022-03-14 DIAGNOSIS — H52202 Unspecified astigmatism, left eye: Secondary | ICD-10-CM | POA: Diagnosis not present

## 2022-03-14 DIAGNOSIS — H04123 Dry eye syndrome of bilateral lacrimal glands: Secondary | ICD-10-CM | POA: Diagnosis not present

## 2022-03-14 DIAGNOSIS — H11123 Conjunctival concretions, bilateral: Secondary | ICD-10-CM | POA: Diagnosis not present

## 2022-03-14 DIAGNOSIS — H11153 Pinguecula, bilateral: Secondary | ICD-10-CM | POA: Diagnosis not present

## 2022-03-14 DIAGNOSIS — H43813 Vitreous degeneration, bilateral: Secondary | ICD-10-CM | POA: Diagnosis not present

## 2022-03-14 DIAGNOSIS — Z961 Presence of intraocular lens: Secondary | ICD-10-CM | POA: Diagnosis not present

## 2022-03-14 DIAGNOSIS — H5201 Hypermetropia, right eye: Secondary | ICD-10-CM | POA: Diagnosis not present

## 2022-03-14 DIAGNOSIS — H524 Presbyopia: Secondary | ICD-10-CM | POA: Diagnosis not present

## 2022-03-30 ENCOUNTER — Other Ambulatory Visit: Payer: Self-pay | Admitting: Family Medicine

## 2022-05-14 ENCOUNTER — Emergency Department (HOSPITAL_BASED_OUTPATIENT_CLINIC_OR_DEPARTMENT_OTHER): Payer: Medicare HMO

## 2022-05-14 ENCOUNTER — Encounter (HOSPITAL_BASED_OUTPATIENT_CLINIC_OR_DEPARTMENT_OTHER): Payer: Self-pay | Admitting: Emergency Medicine

## 2022-05-14 ENCOUNTER — Emergency Department (HOSPITAL_BASED_OUTPATIENT_CLINIC_OR_DEPARTMENT_OTHER)
Admission: EM | Admit: 2022-05-14 | Discharge: 2022-05-14 | Disposition: A | Discharge status: Home / Self Care | Payer: Medicare HMO | Attending: Emergency Medicine | Admitting: Emergency Medicine

## 2022-05-14 ENCOUNTER — Other Ambulatory Visit: Payer: Self-pay

## 2022-05-14 DIAGNOSIS — Y9342 Activity, yoga: Secondary | ICD-10-CM | POA: Diagnosis not present

## 2022-05-14 DIAGNOSIS — S0081XA Abrasion of other part of head, initial encounter: Secondary | ICD-10-CM | POA: Diagnosis not present

## 2022-05-14 DIAGNOSIS — R42 Dizziness and giddiness: Secondary | ICD-10-CM | POA: Insufficient documentation

## 2022-05-14 DIAGNOSIS — S0990XA Unspecified injury of head, initial encounter: Secondary | ICD-10-CM | POA: Diagnosis not present

## 2022-05-14 DIAGNOSIS — R55 Syncope and collapse: Secondary | ICD-10-CM | POA: Diagnosis not present

## 2022-05-14 DIAGNOSIS — W228XXA Striking against or struck by other objects, initial encounter: Secondary | ICD-10-CM | POA: Insufficient documentation

## 2022-05-14 DIAGNOSIS — Z5321 Procedure and treatment not carried out due to patient leaving prior to being seen by health care provider: Secondary | ICD-10-CM | POA: Diagnosis not present

## 2022-05-14 LAB — BASIC METABOLIC PANEL
Anion gap: 12 (ref 5–15)
BUN: 21 mg/dL (ref 8–23)
CO2: 25 mmol/L (ref 22–32)
Calcium: 9 mg/dL (ref 8.9–10.3)
Chloride: 103 mmol/L (ref 98–111)
Creatinine, Ser: 1.38 mg/dL — ABNORMAL HIGH (ref 0.61–1.24)
GFR, Estimated: 52 mL/min — ABNORMAL LOW (ref >60)
Glucose, Bld: 133 mg/dL — ABNORMAL HIGH (ref 70–99)
Potassium: 3.9 mmol/L (ref 3.5–5.1)
Sodium: 140 mmol/L (ref 135–145)

## 2022-05-14 LAB — CBC
HCT: 40.6 % (ref 39.0–52.0)
Hemoglobin: 12.6 g/dL — ABNORMAL LOW (ref 13.0–17.0)
MCH: 24.4 pg — ABNORMAL LOW (ref 26.0–34.0)
MCHC: 31 g/dL (ref 30.0–36.0)
MCV: 78.5 fL — ABNORMAL LOW (ref 80.0–100.0)
Platelets: 358 10*3/uL (ref 150–400)
RBC: 5.17 MIL/uL (ref 4.22–5.81)
RDW: 15.4 % (ref 11.5–15.5)
WBC: 8.8 10*3/uL (ref 4.0–10.5)
nRBC: 0 % (ref 0.0–0.2)

## 2022-05-14 LAB — CBG MONITORING, ED: Glucose-Capillary: 145 mg/dL — ABNORMAL HIGH (ref 70–99)

## 2022-05-14 NOTE — ED Notes (Signed)
Called again no answer  

## 2022-05-14 NOTE — ED Triage Notes (Signed)
Pt arrived POV, caox4, and ambulatory. Pt states he was at home doing yoga when he started to feel flushed and light-headed so he sat down but passed out for approx 15 seconds. Pt hit head on concrete floor. Pt had small abrasion to the top of the head with no active bleeding. Pt denies taking blood thinner medication. Pt reports he has had syncope episodes in the past d/t dehydration. Pt denies pain, dizziness, SOB, n/v or any other s/s at present.

## 2022-05-14 NOTE — ED Notes (Signed)
CBG results 145 given to [place]

## 2022-05-14 NOTE — ED Notes (Signed)
Patient called for room with no answer x 3

## 2022-05-26 ENCOUNTER — Telehealth: Payer: Self-pay

## 2022-05-26 DIAGNOSIS — E7800 Pure hypercholesterolemia, unspecified: Secondary | ICD-10-CM

## 2022-05-26 DIAGNOSIS — G4700 Insomnia, unspecified: Secondary | ICD-10-CM

## 2022-05-26 MED ORDER — ATORVASTATIN CALCIUM 10 MG PO TABS: 10.0000 mg | ORAL_TABLET | Freq: Every day | ORAL | 3 refills | Status: DC

## 2022-05-26 MED ORDER — LORAZEPAM 0.5 MG PO TABS: 0.5000 mg | ORAL_TABLET | Freq: Two times a day (BID) | ORAL | 1 refills | Status: DC | PRN

## 2022-05-26 NOTE — Telephone Encounter (Signed)
Pharmacy send a rx for Atorvastatin 10 mg

## 2022-05-26 NOTE — Telephone Encounter (Signed)
[facility] lor[facility]epam 0.'5mg'$  tablet. I do not see on medication list  Message routed to Alain Honey, MD

## 2022-05-27 ENCOUNTER — Other Ambulatory Visit: Payer: Self-pay

## 2022-05-27 DIAGNOSIS — Z85828 Personal history of other malignant neoplasm of skin: Secondary | ICD-10-CM | POA: Diagnosis not present

## 2022-05-27 DIAGNOSIS — L853 Xerosis cutis: Secondary | ICD-10-CM | POA: Diagnosis not present

## 2022-05-27 DIAGNOSIS — B354 Tinea corporis: Secondary | ICD-10-CM | POA: Diagnosis not present

## 2022-05-27 DIAGNOSIS — L570 Actinic keratosis: Secondary | ICD-10-CM | POA: Diagnosis not present

## 2022-05-27 MED ORDER — LISINOPRIL 20 MG PO TABS: 20.0000 mg | ORAL_TABLET | Freq: Every day | ORAL | 3 refills | Status: DC

## 2022-05-30 DIAGNOSIS — Z23 Encounter for immunization: Secondary | ICD-10-CM | POA: Diagnosis not present

## 2022-06-07 ENCOUNTER — Other Ambulatory Visit: Payer: Self-pay | Admitting: Family Medicine

## 2022-07-01 DIAGNOSIS — L820 Inflamed seborrheic keratosis: Secondary | ICD-10-CM | POA: Diagnosis not present

## 2022-07-01 DIAGNOSIS — L570 Actinic keratosis: Secondary | ICD-10-CM | POA: Diagnosis not present

## 2022-07-01 DIAGNOSIS — B354 Tinea corporis: Secondary | ICD-10-CM | POA: Diagnosis not present

## 2022-07-01 DIAGNOSIS — Z85828 Personal history of other malignant neoplasm of skin: Secondary | ICD-10-CM | POA: Diagnosis not present

## 2022-07-01 DIAGNOSIS — L853 Xerosis cutis: Secondary | ICD-10-CM | POA: Diagnosis not present

## 2022-07-29 DIAGNOSIS — L820 Inflamed seborrheic keratosis: Secondary | ICD-10-CM | POA: Diagnosis not present

## 2022-07-29 DIAGNOSIS — L821 Other seborrheic keratosis: Secondary | ICD-10-CM | POA: Diagnosis not present

## 2022-07-29 DIAGNOSIS — L570 Actinic keratosis: Secondary | ICD-10-CM | POA: Diagnosis not present

## 2022-07-29 DIAGNOSIS — L814 Other melanin hyperpigmentation: Secondary | ICD-10-CM | POA: Diagnosis not present

## 2022-07-29 DIAGNOSIS — Z85828 Personal history of other malignant neoplasm of skin: Secondary | ICD-10-CM | POA: Diagnosis not present

## 2022-09-06 ENCOUNTER — Other Ambulatory Visit: Payer: Self-pay | Admitting: Family Medicine

## 2022-09-09 ENCOUNTER — Telehealth: Payer: Self-pay | Admitting: *Deleted

## 2022-09-09 DIAGNOSIS — B351 Tinea unguium: Secondary | ICD-10-CM | POA: Diagnosis not present

## 2022-09-09 DIAGNOSIS — M79671 Pain in right foot: Secondary | ICD-10-CM | POA: Diagnosis not present

## 2022-09-09 DIAGNOSIS — L84 Corns and callosities: Secondary | ICD-10-CM | POA: Diagnosis not present

## 2022-09-09 DIAGNOSIS — M79672 Pain in left foot: Secondary | ICD-10-CM | POA: Diagnosis not present

## 2022-09-09 NOTE — Telephone Encounter (Signed)
Patient scheduled an appointment to be seen 09/17/2022

## 2022-09-09 NOTE — Telephone Encounter (Signed)
Patient called and left message on Clinical Intake stating that he needed labs done to check his LFT's due to starting a Antifungal Medication.   Tried calling patient LMOM to return call.  Patient has not been seen in over a year. Calling patient to schedule an appointment to have labs ordered. Antifungal Mediation is not in patients' Current medication list.

## 2022-09-17 ENCOUNTER — Encounter: Payer: Self-pay | Admitting: Family Medicine

## 2022-09-17 ENCOUNTER — Other Ambulatory Visit: Payer: Self-pay | Admitting: Family Medicine

## 2022-09-17 ENCOUNTER — Non-Acute Institutional Stay (INDEPENDENT_AMBULATORY_CARE_PROVIDER_SITE_OTHER): Payer: Medicare HMO | Admitting: Family Medicine

## 2022-09-17 VITALS — BP 124/76 | HR 78 | Temp 97.5°F | Ht 66.0 in | Wt 197.2 lb

## 2022-09-17 DIAGNOSIS — I10 Essential (primary) hypertension: Secondary | ICD-10-CM

## 2022-09-17 DIAGNOSIS — G2581 Restless legs syndrome: Secondary | ICD-10-CM

## 2022-09-17 DIAGNOSIS — E7800 Pure hypercholesterolemia, unspecified: Secondary | ICD-10-CM

## 2022-09-17 DIAGNOSIS — M48061 Spinal stenosis, lumbar region without neurogenic claudication: Secondary | ICD-10-CM

## 2022-09-17 DIAGNOSIS — G4700 Insomnia, unspecified: Secondary | ICD-10-CM

## 2022-09-17 MED ORDER — ROPINIROLE HCL 0.25 MG PO TABS: 0.5000 mg | ORAL_TABLET | Freq: Every day | ORAL | Status: DC

## 2022-09-17 NOTE — Progress Notes (Signed)
Provider:  Alain Honey, MD  Careteam: Patient Care Team: [name], MD as PCP - General (Family Medicine)  PLACE OF SERVICE:  [facility]  Advanced Directive information    Allergies  Allergen Reactions   Lamisil [Terbinafine]    Sulfa Antibiotics     No chief complaint on file.    HPI: Patient is a 82 y.o. male patient has not been seen in over 1 year.  During that time he had back surgery which helped but not totally.  He has hypertension as well as hyperlipidemia. He had a syncopal episode and was seen in the hospital without etiology.  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  He takes about 30-minute nap each day but his chief complaint today is insomnia.  He was given lorazepam at the last visit and that has never totally helped with sleep.  He gets about 5 hours sleep per night.  He is active in the daytime works out several days a week in the gym here at the facility. He denies any depression and he has no other stigmata which would suggest same. He does complain of what sounds like restless legs and that frequently keeps him awake  Review of Systems:  Review of Systems  Constitutional: Negative.   HENT: Negative.    Respiratory: Negative.    Cardiovascular: Negative.   Gastrointestinal: Negative.   Musculoskeletal:  Positive for back pain.  Neurological: Negative.   Psychiatric/Behavioral:  The patient has insomnia.   All other systems reviewed and are negative.   Past Medical History:  Diagnosis Date   Hypertension    Prostate cancer [facility])    Past Surgical History:  Procedure Laterality Date   NOSE SURGERY  12/12/2019   PROSTATE SURGERY  2005   [name]   Social History:   reports that he has never smoked. He has never used smokeless tobacco. He reports that he does not drink alcohol. No history on file for drug use.  Family History  Problem Relation Age of Onset   Congestive Heart Failure Mother      Medications: Patient's Medications  New Prescriptions   No medications on file  Previous Medications   AMLODIPINE (NORVASC) 5 MG TABLET    Take 1 tablet (5 mg total) by mouth daily. Needs an appointment before anymore future refills.   ATORVASTATIN (LIPITOR) 10 MG TABLET    Take 1 tablet (10 mg total) by mouth daily.   CHOLECALCIFEROL (VITAMIN D3 PO)    Take 50 mcg by mouth daily.   LISINOPRIL (ZESTRIL) 20 MG TABLET    Take 1 tablet (20 mg total) by mouth daily.   LORAZEPAM (ATIVAN) 0.5 MG TABLET    Take 1 tablet (0.5 mg total) by mouth 2 (two) times daily as needed for anxiety.   MAGNESIUM GLYCINATE PO    Take 400 mg by mouth daily.  Modified Medications   No medications on file  Discontinued Medications   No medications on file    Physical Exam:  There were no vitals filed for this visit. There is no height or weight on file to calculate BMI. Wt Readings from Last 3 Encounters:  05/14/22 192 lb (87.1 kg)  10/24/21 195 lb 6.4 oz (88.6 kg)  06/19/21 199 lb (90.3 kg)    Physical Exam Vitals and nursing note reviewed.  Constitutional:      Appearance: Normal appearance.  Cardiovascular:     Rate and Rhythm:  Normal rate and regular rhythm.  Pulmonary:     Effort: Pulmonary effort is normal.     Breath sounds: Normal breath sounds.  Musculoskeletal:        General: Normal range of motion.  Skin:    General: Skin is warm and dry.  Neurological:     General: No focal deficit present.     Mental Status: He is oriented to person, place, and time.     Labs reviewed: Basic Metabolic Panel: Recent Labs    05/14/22 1239  NA 140  K 3.9  CL 103  CO2 25  GLUCOSE 133*  BUN 21  CREATININE 1.38*  CALCIUM 9.0   Liver Function Tests: No results for input(s): "AST", "ALT", "ALKPHOS", "BILITOT", "PROT", "ALBUMIN" in the last 8760 hours. No results for input(s): "LIPASE", "AMYLASE" in the last 8760 hours. No results for input(s): "AMMONIA" in the last 8760  hours. CBC: Recent Labs    05/14/22 1239  WBC 8.8  HGB 12.6*  HCT 40.6  MCV 78.5*  PLT 358   Lipid Panel: No results for input(s): "CHOL", "HDL", "LDLCALC", "TRIG", "CHOLHDL", "LDLDIRECT" in the last 8760 hours. TSH: No results for input(s): "TSH" in the last 8760 hours. A1C: Lab Results  Component Value Date   HGBA1C 5.4 12/27/2019     Assessment/Plan  1. Hypercholesterolemia Lipids were last assessed little over a year ago; LDL was 72.  Continues on low-dose atorvastatin  2. Restless legs syndrome Seems like this fingers and to his insomnia.  Will begin ropinirole 0.5 mg and titrate depending on response  3. Essential hypertension Blood pressure today 124/76 on combination lisinopril and amlodipine  4. Foraminal stenosis of lumbar region Improved but still has occasional symptoms  5. Insomnia, unspecified type Can hopefully improve insomnia with ropinirole that were using for restless legs    Alain Honey, MD [facility] [phone]

## 2022-09-18 ENCOUNTER — Other Ambulatory Visit: Payer: Medicare HMO

## 2022-09-18 ENCOUNTER — Encounter: Payer: Self-pay | Admitting: Family Medicine

## 2022-09-18 DIAGNOSIS — E7800 Pure hypercholesterolemia, unspecified: Secondary | ICD-10-CM | POA: Diagnosis not present

## 2022-09-18 LAB — COMPLETE METABOLIC PANEL WITH GFR
AG Ratio: 1.7 (calc) (ref 1.0–2.5)
ALT: 16 U/L (ref 9–46)
AST: 15 U/L (ref 10–35)
Albumin: 3.6 g/dL (ref 3.6–5.1)
Alkaline phosphatase (APISO): 94 U/L (ref 35–144)
BUN/Creatinine Ratio: 16 (calc) (ref 6–22)
BUN: 22 mg/dL (ref 7–25)
CO2: 26 mmol/L (ref 20–32)
Calcium: 8.6 mg/dL (ref 8.6–10.3)
Chloride: 107 mmol/L (ref 98–110)
Creat: 1.4 mg/dL — ABNORMAL HIGH (ref 0.70–1.28)
Globulin: 2.1 g/dL (calc) (ref 1.9–3.7)
Glucose, Bld: 102 mg/dL — ABNORMAL HIGH (ref 65–99)
Potassium: 4.1 mmol/L (ref 3.5–5.3)
Sodium: 141 mmol/L (ref 135–146)
Total Bilirubin: 0.5 mg/dL (ref 0.2–1.2)
Total Protein: 5.7 g/dL — ABNORMAL LOW (ref 6.1–8.1)
eGFR: 51 mL/min/{1.73_m2} — ABNORMAL LOW (ref >=60)

## 2022-09-18 LAB — LIPID PANEL
Cholesterol: 131 mg/dL (ref <200)
HDL: 47 mg/dL (ref >=40)
LDL Cholesterol (Calc): 67 mg/dL (calc)
Non-HDL Cholesterol (Calc): 84 mg/dL (calc) (ref <130)
Total CHOL/HDL Ratio: 2.8 (calc) (ref <5.0)
Triglycerides: 89 mg/dL (ref <150)

## 2022-09-18 MED ORDER — ROPINIROLE HCL 0.5 MG PO TABS: 0.5000 mg | ORAL_TABLET | Freq: Every day | ORAL | 5 refills | Status: DC

## 2022-09-18 NOTE — Telephone Encounter (Signed)
Patient stated that pharmacy did not received Requip Rx that Dr. Sabra Heck prescribed.   Medication was placed as a Syringe and not sent to pharmacy. Corrected and sent Rx as requested.

## 2022-09-30 DIAGNOSIS — D2372 Other benign neoplasm of skin of left lower limb, including hip: Secondary | ICD-10-CM | POA: Diagnosis not present

## 2022-09-30 DIAGNOSIS — L814 Other melanin hyperpigmentation: Secondary | ICD-10-CM | POA: Diagnosis not present

## 2022-09-30 DIAGNOSIS — L570 Actinic keratosis: Secondary | ICD-10-CM | POA: Diagnosis not present

## 2022-09-30 DIAGNOSIS — Z85828 Personal history of other malignant neoplasm of skin: Secondary | ICD-10-CM | POA: Diagnosis not present

## 2022-11-20 ENCOUNTER — Ambulatory Visit (INDEPENDENT_AMBULATORY_CARE_PROVIDER_SITE_OTHER): Payer: Medicare HMO | Admitting: Nurse Practitioner

## 2022-11-20 ENCOUNTER — Encounter: Payer: Self-pay | Admitting: Nurse Practitioner

## 2022-11-20 VITALS — BP 130/80 | HR 67 | Temp 98.4°F | Resp 18 | Ht 66.0 in | Wt 198.0 lb

## 2022-11-20 DIAGNOSIS — Z0000 Encounter for general adult medical examination without abnormal findings: Secondary | ICD-10-CM | POA: Diagnosis not present

## 2022-11-20 NOTE — Progress Notes (Signed)
Subjective:   Patrick [name] is a 82 y.o. male who presents for Medicare Annual/Subsequent preventive examination @ clinic [place].        Objective:    Today's Vitals   11/20/22 1534 11/20/22 1550  BP: 130/80   Pulse: 67   Resp: 18   Temp: 98.4 F (36.9 C)   SpO2: 95%   Weight: 198 lb (89.8 kg)   Height: 5\' 6"  (1.676 m)   PainSc:  3    Body mass index is 31.96 kg/m.     05/14/2022   12:36 PM 10/24/2021    4:12 PM 12/19/2020    1:15 PM 06/04/2020    3:56 PM  Advanced Directives  Does Patient Have a Medical Advance Directive? Yes Yes Yes Yes  Type of Paramedic of [place];Living will;Out of facility DNR (pink MOST or yellow form) Healthcare Power of [place];Living will Living will;Healthcare Power of Attorney  Does patient want to make changes to medical advance directive?  No - Patient declined No - Patient declined No - Patient declined  Copy of [place] in Chart? Yes - validated most recent copy scanned in chart (See row information) No - copy requested No - copy requested     Current Medications (verified) Outpatient Encounter Medications as of 11/20/2022  Medication Sig   amLODipine (NORVASC) 5 MG tablet Take 1 tablet (5 mg total) by mouth daily.   atorvastatin (LIPITOR) 10 MG tablet Take 1 tablet (10 mg total) by mouth daily.   Cholecalciferol (VITAMIN D3 PO) Take 50 mcg by mouth daily.   lisinopril (ZESTRIL) 20 MG tablet Take 1 tablet (20 mg total) by mouth daily.   LORazepam (ATIVAN) 0.5 MG tablet Take 1 tablet (0.5 mg total) by mouth 2 (two) times daily as needed for anxiety.   rOPINIRole (REQUIP) 0.5 MG tablet Take 1 tablet (0.5 mg total) by mouth at bedtime.   [DISCONTINUED] MAGNESIUM GLYCINATE PO Take 400 mg by mouth daily. (Patient not taking: Reported on 09/17/2022)   No facility-administered encounter medications on file as of 11/20/2022.    Allergies (verified) Lamisil  [terbinafine] and Sulfa antibiotics   History: Past Medical History:  Diagnosis Date   Hypertension    Prostate cancer    Past Surgical History:  Procedure Laterality Date   NOSE SURGERY  12/12/2019   PROSTATE SURGERY  2005   [name]   Family History  Problem Relation Age of Onset   Congestive Heart Failure Mother    Social History   Socioeconomic History   Marital status: Married    Spouse name: Not on file   Number of children: Not on file   Years of education: Not on file   Highest education level: Not on file  Occupational History   Not on file  Tobacco Use   Smoking status: Never   Smokeless tobacco: Never  Vaping Use   Vaping Use: Never used  Substance and Sexual Activity   Alcohol use: Never   Drug use: Not on file   Sexual activity: Not on file  Other Topics Concern   Not on file  Social History Narrative   Social History      Diet? n/a      Do you drink/eat things with caffeine? yes      Marital status?  Married  What year were you married? 1965      Do you live in a house, apartment, assisted living, condo, trailer, etc.? [place]       Is it one or more stories? 4      How many persons live in your home? 2      Do you have any pets in your home? (please list) no      Highest level of education completed? Masters      Current or past profession: clergy       Do you exercise?                   yes                   Type & how often? Walk weights      Advanced Directives      Do you have a living will?yes       Do you have a DNR form?                                  If not, do you want to discuss one? yes      Do you have signed POA/HPOA for forms? yes      Functional Status      Do you have difficulty bathing or dressing yourself? no      Do you have difficulty preparing food or eating? no      Do you have difficulty managing your medications?no      Do you have difficulty  managing your finances?no      Do you have difficulty affording your medications?no      Social Determinants of Health   Financial Resource Strain: Not on file  Food Insecurity: Not on file  Transportation Needs: Not on file  Physical Activity: Not on file  Stress: Not on file  Social Connections: Not on file    Tobacco Counseling Counseling given: Not Answered   Clinical Intake:  Pre-visit preparation completed: Yes  Pain : 0-10 Pain Score: 3  Pain Type: Chronic pain Pain Location: Hip Pain Orientation: Right Pain Descriptors / Indicators: Aching, Dull Pain Onset: More than a month ago Pain Frequency: Constant Pain Relieving Factors: none, not taking Tylenol or Iburpfen Effect of Pain on Daily Activities: no  Pain Relieving Factors: none, not taking Tylenol or Iburpfen  BMI - recorded: 31.96 Nutritional Status: BMI > 30  Obese Nutritional Risks: None Diabetes: No  How often do you need to have someone help you when you read instructions, pamphlets, or other written materials from your doctor or pharmacy?: 1 - Never What is the last grade level you completed in school?: master degree  Diabetic?no  Interpreter Needed?: No  Information entered by :: Krystal Delduca Bretta Bang NP   Activities of Daily Living     No data to display          Patient Care Team: [name], MD as PCP - General (Family Medicine)  Indicate any recent Medical Services you may have received from other than [facility] providers in the past year (date may be approximate).     Assessment:   This is a routine wellness examination for Patrick [name].  Hearing/Vision screen Hearing Screening - Comments:: No problem with hearing  Vision Screening - Comments:: No problem with your vision  Dietary issues and exercise activities discussed:  Goals Addressed             This Visit's Progress    Weight (lb) < 200 lb (90.7 kg)   198 lb (89.8 kg)      Depression Screen    11/20/2022    3:32  PM 10/24/2021    4:13 PM 06/21/2020    1:08 PM  PHQ 2/9 Scores  PHQ - 2 Score 0 0 0  PHQ- 9 Score  3 1    Fall Risk    11/20/2022    3:32 PM 09/17/2022    1:57 PM 10/24/2021    4:12 PM 03/27/2021    3:26 PM 12/19/2020    1:15 PM  Fall Risk   Falls in the past year? 0 0 0 0 0  Number falls in past yr: 0 0 0 0 0  Injury with Fall? 0 0 0 0 0  Risk for fall due to : No Fall Risks No Fall Risks No Fall Risks No Fall Risks   Follow up Falls evaluation completed Falls evaluation completed Falls evaluation completed Falls evaluation completed;Education provided;Falls prevention discussed     FALL RISK PREVENTION PERTAINING TO THE HOME:  Any stairs in or around the home? Yes  If so, are there any without handrails? No  Home free of loose throw rugs in walkways, pet beds, electrical cords, etc? Yes  Adequate lighting in your home to reduce risk of falls? Yes   ASSISTIVE DEVICES UTILIZED TO PREVENT FALLS:  Life alert? No  Use of a cane, walker or w/c? No  Grab bars in the bathroom? Yes  Shower chair or bench in shower? No  Elevated toilet seat or a handicapped toilet? No   TIMED UP AND GO:  Was the test performed? No .   Gait steady and fast without use of assistive device  Cognitive Function:    10/24/2021    4:15 PM 06/21/2020    1:10 PM  MMSE - Mini Mental State Exam  Orientation to time 5 5  Orientation to Place 5 5  Registration 3 3  Attention/ Calculation 1 1  Recall 3 3  Language- name 2 objects 2 2  Language- repeat 1 1  Language- follow 3 step command 3 3  Language- read & follow direction 1 1  Write a sentence 1 1  Copy design 1 1  Total score 26 26        11/20/2022    3:32 PM  6CIT Screen  What Year? 0 points  What month? 0 points  What time? 0 points  Count back from 20 0 points  Months in reverse 0 points  Repeat phrase 0 points  Total Score 0 points    Immunizations Immunization History  Administered Date(s) Administered   Covid-19,  Mrna,Vaccine(Spikevax)26yrs and older 05/30/2022   Fluad Quad(high Dose 65+) 05/30/2022   Hepatitis A 06/26/2000   Hepatitis A, Ped/Adol-2 Dose 06/26/2000   Influenza Split 08/09/2008   Influenza, High Dose Seasonal PF 05/27/2016, 05/08/2020   Influenza-Unspecified 08/09/2008, 05/18/2021   Moderna SARS-COV2 Booster Vaccination 12/17/2020   Moderna Sars-Covid-2 Vaccination 08/22/2019, 09/19/2019, 06/26/2020   PFIZER(Purple Top)SARS-COV-2 Vaccination 05/09/2021   Pneumococcal Conjugate-13 11/23/2015   Pneumococcal Polysaccharide-23 04/03/2017   Td (Adult),5 Lf Tetanus Toxid, Preservative Free 08/18/2001   Tdap 08/18/2001, 12/12/2013   Zoster Recombinat (Shingrix) 11/03/2017, 01/18/2018   Zoster, Live 08/18/2004    TDAP status: Up to date  Flu Vaccine status: Up to date  Pneumococcal vaccine status: Up to  date  Covid-19 vaccine status: Completed vaccines  Qualifies for Shingles Vaccine? Yes   Zostavax completed Yes   Shingrix Completed?: Yes  Screening Tests Health Maintenance  Topic Date Due   COVID-19 Vaccine (6 - 2023-24 season) 07/25/2022   Hepatitis C Screening  11/20/2023 (Originally 04/05/1959)   INFLUENZA VACCINE  03/19/2023   Medicare Annual Wellness (AWV)  11/20/2023   DTaP/Tdap/Td (4 - Td or Tdap) 12/13/2023   Pneumonia Vaccine 58+ Years old  Completed   Zoster Vaccines- Shingrix  Completed   HPV VACCINES  Aged Out    Health Maintenance  Health Maintenance Due  Topic Date Due   COVID-19 Vaccine (6 - 2023-24 season) 07/25/2022    Colorectal cancer screening: No longer required.   Lung Cancer Screening: (Low Dose CT Chest recommended if Age 7-80 years, 30 pack-year currently smoking OR have quit w/in 15years.) does not qualify.     Additional Screening:  Hepatitis C Screening: does not qualify; Completed   Vision Screening: Recommended annual ophthalmology exams for early detection of glaucoma and other disorders of the eye. Is the patient up to date  with their annual eye exam?  Yes  Who is the provider or what is the name of the office in which the patient attends annual eye exams? Dr. Amalia Hailey  If pt is not established with a provider, would they like to be referred to a provider to establish care? No .   Dental Screening: Recommended annual dental exams for proper oral hygiene  Community Resource Referral / Chronic Care Management: CRR required this visit?  No   CCM required this visit?  No      Plan:     I have personally reviewed and noted the following in the patient's chart:   Medical and social history Use of alcohol, tobacco or illicit drugs  Current medications and supplements including opioid prescriptions. Patient is not currently taking opioid prescriptions. Functional ability and status Nutritional status Physical activity Advanced directives List of other physicians Hospitalizations, surgeries, and ER visits in previous 12 months Vitals Screenings to include cognitive, depression, and falls Referrals and appointments  In addition, I have reviewed and discussed with patient certain preventive protocols, quality metrics, and best practice recommendations. A written personalized care plan for preventive services as well as general preventive health recommendations were provided to patient.     Maxden Naji X Zakirah Weingart, NP   11/20/2022

## 2022-12-08 ENCOUNTER — Emergency Department (HOSPITAL_BASED_OUTPATIENT_CLINIC_OR_DEPARTMENT_OTHER)
Admission: EM | Admit: 2022-12-08 | Discharge: 2022-12-08 | Disposition: A | Discharge status: Home / Self Care | Payer: Medicare HMO | Attending: Emergency Medicine | Admitting: Emergency Medicine

## 2022-12-08 ENCOUNTER — Emergency Department (HOSPITAL_BASED_OUTPATIENT_CLINIC_OR_DEPARTMENT_OTHER): Payer: Medicare HMO | Admitting: Radiology

## 2022-12-08 ENCOUNTER — Emergency Department (HOSPITAL_BASED_OUTPATIENT_CLINIC_OR_DEPARTMENT_OTHER): Payer: Medicare HMO

## 2022-12-08 ENCOUNTER — Encounter (HOSPITAL_BASED_OUTPATIENT_CLINIC_OR_DEPARTMENT_OTHER): Payer: Self-pay | Admitting: *Deleted

## 2022-12-08 ENCOUNTER — Other Ambulatory Visit: Payer: Self-pay

## 2022-12-08 DIAGNOSIS — S62514A Nondisplaced fracture of proximal phalanx of right thumb, initial encounter for closed fracture: Secondary | ICD-10-CM | POA: Diagnosis not present

## 2022-12-08 DIAGNOSIS — S299XXA Unspecified injury of thorax, initial encounter: Secondary | ICD-10-CM | POA: Diagnosis not present

## 2022-12-08 DIAGNOSIS — M79644 Pain in right finger(s): Secondary | ICD-10-CM | POA: Insufficient documentation

## 2022-12-08 DIAGNOSIS — I10 Essential (primary) hypertension: Secondary | ICD-10-CM | POA: Insufficient documentation

## 2022-12-08 DIAGNOSIS — S2242XA Multiple fractures of ribs, left side, initial encounter for closed fracture: Secondary | ICD-10-CM | POA: Insufficient documentation

## 2022-12-08 DIAGNOSIS — Y92410 Unspecified street and highway as the place of occurrence of the external cause: Secondary | ICD-10-CM | POA: Diagnosis not present

## 2022-12-08 DIAGNOSIS — Z79899 Other long term (current) drug therapy: Secondary | ICD-10-CM | POA: Diagnosis not present

## 2022-12-08 DIAGNOSIS — R55 Syncope and collapse: Secondary | ICD-10-CM | POA: Diagnosis not present

## 2022-12-08 DIAGNOSIS — Z043 Encounter for examination and observation following other accident: Secondary | ICD-10-CM | POA: Diagnosis not present

## 2022-12-08 DIAGNOSIS — R0789 Other chest pain: Secondary | ICD-10-CM | POA: Diagnosis not present

## 2022-12-08 DIAGNOSIS — S2243XA Multiple fractures of ribs, bilateral, initial encounter for closed fracture: Secondary | ICD-10-CM | POA: Diagnosis not present

## 2022-12-08 HISTORY — DX: Unspecified right bundle-branch block: I45.10

## 2022-12-08 MED ORDER — OXYCODONE-ACETAMINOPHEN 5-325 MG PO TABS
1.0000 | ORAL_TABLET | Freq: Once | ORAL | Status: AC
  Administered 2022-12-08: 1 via ORAL
  Filled 2022-12-08: qty 1

## 2022-12-08 MED ORDER — OXYCODONE-ACETAMINOPHEN 5-325 MG PO TABS: 1.0000 | ORAL_TABLET | Freq: Four times a day (QID) | ORAL | 0 refills | Status: DC | PRN

## 2022-12-08 NOTE — ED Provider Notes (Signed)
[place] EMERGENCY DEPARTMENT AT [facility] ([place] Provider Note   CSN: [id number] Arrival date & time: 12/08/22  0440     History  Chief Complaint  Patient presents with   Patrick [name]    Patrick [name] is a 82 y.o. male.  HPI     This is a 82 year old male who presents with left chest pain and anterior chest pain.  Patient reports that he fell out of the back of a truck on Saturday morning.  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  He has been worked up with this in the past.  He had been working in a garden when he began to feel lightheaded.  He sat down and "all of a sudden I was on the ground."  He was evaluated by EMS and states that he overall feels okay.  However has had progressive pain over the left chest wall, anterior chest and between the scapulas.  He has been ambulatory.  He has been using Tylenol and ibuprofen with minimal relief.  No significant shortness of breath.   Chart reviewed.  He does have documentation of history of vasovagal syncope with reported right bundle branch block on Zio patch.  He has had some changes in his blood pressure medication to help manage this.  Home Medications Prior to Admission medications   Medication Sig Start Date End Date Taking? Authorizing Provider  oxyCODONE-acetaminophen (PERCOCET/ROXICET) 5-325 MG tablet Take 1 tablet by mouth every 6 (six) hours as needed for severe pain. 12/08/22  Yes Muna Demers, Mayer Masker, MD  amLODipine (NORVASC) 5 MG tablet Take 1 tablet (5 mg total) by mouth daily. 09/18/22   Frederica Kuster, MD  atorvastatin (LIPITOR) 10 MG tablet Take 1 tablet (10 mg total) by mouth daily. 05/26/22   Frederica Kuster, MD  Cholecalciferol (VITAMIN D3 PO) Take 50 mcg by mouth daily.    [provider]  lisinopril (ZESTRIL) 20 MG tablet Take 1 tablet (20 mg total) by mouth daily. 05/27/22   Frederica Kuster, MD  LORazepam (ATIVAN) 0.5 MG tablet Take 1 tablet (0.5 mg  total) by mouth 2 (two) times daily as needed for anxiety. 05/26/22   Frederica Kuster, MD      Allergies    Lamisil [terbinafine] and Sulfa antibiotics    Review of Systems   Review of Systems  Constitutional:  Negative for fever.  Respiratory:  Negative for shortness of breath.   Cardiovascular:  Negative for chest pain.  Gastrointestinal:  Negative for abdominal pain.  Musculoskeletal:  Positive for back pain.  Neurological:  Positive for syncope.  All other systems reviewed and are negative.   Physical Exam Updated Vital Signs BP (!) 155/83   Pulse 64   Temp 98.1 F (36.7 C)   Resp 17   Ht 1.676 m ( )   Wt 89.4 kg   SpO2 96%   BMI 31.80 kg/m  Physical Exam Vitals and nursing note reviewed.  Constitutional:      Appearance: He is well-developed.  HENT:     Head: Normocephalic and atraumatic.     Nose: Nose normal.     Mouth/Throat:     Mouth: Mucous membranes are moist.  Eyes:     Pupils: Pupils are equal, round, and reactive to light.  Cardiovascular:     Rate and Rhythm: Normal rate and regular rhythm.     Heart sounds: Normal heart sounds. No murmur heard.  Comments: Tenderness to palpation left chest wall, no overlying skin changes or crepitus noted Pulmonary:     Effort: Pulmonary effort is normal. No respiratory distress.     Breath sounds: Normal breath sounds. No wheezing.  Abdominal:     General: Bowel sounds are normal.     Palpations: Abdomen is soft.     Tenderness: There is no abdominal tenderness. There is no rebound.  Musculoskeletal:     Cervical back: Neck supple.     Comments: No midline tenderness to palpation of the T or L-spine, there is slight swelling and tenderness palpation over the distal proximal phalanx of the right first digit, flexion and extension intact, neurovascularly intact  Lymphadenopathy:     Cervical: No cervical adenopathy.  Skin:    General: Skin is warm and dry.  Neurological:     Mental Status: He is alert  and oriented to person, place, and time.     ED Results / Procedures / Treatments   Labs (all labs ordered are listed, but only abnormal results are displayed) Labs Reviewed - No data to display  EKG EKG Interpretation  Date/Time:  Monday December 08 2022 05:43:10 EDT Ventricular Rate:  72 PR Interval:  160 QRS Duration: 138 QT Interval:  435 QTC Calculation: 477 R Axis:   -10 Text Interpretation: Sinus rhythm Right bundle branch block Confirmed by Ross Marcus ([id number]) on 12/08/2022 5:55:40 AM  Radiology DG Finger Thumb Right  Result Date: 12/08/2022 CLINICAL DATA:  82 year old male status post syncope and fall from the back of a truck two mornings ago.  Pain. EXAM: RIGHT THUMB 2+V COMPARISON:  None Available. FINDINGS: Three views the right thumb. Bone mineralization is within normal limits for age. On image #1 there is questionable cortical irregularity along the articular surface of the head of the thumb proximal phalanx. But this is not correlated on the remaining two views. No definite soft tissue swelling there either. The thumb IP joint remains aligned. Distal phalanx appears intact. Thumb metacarpal appears intact. Other visible osseous structures grossly intact. IMPRESSION: Suspect artifact rather than intra-articular fracture at the head of the thumb proximal phalanx. Correlate for point tenderness there, which if present would elevate the likelihood of nondisplaced fracture. Electronically Signed   By: Odessa Fleming M.D.   On: 12/08/2022 06:01   CT Cervical Spine Wo Contrast  Result Date: 12/08/2022 CLINICAL DATA:  82 year old male status post syncope and fall from the back of a truck two mornings ago. Feeling worse now. EXAM: CT CERVICAL SPINE WITHOUT CONTRAST TECHNIQUE: Multidetector CT imaging of the cervical spine was performed without intravenous contrast. Multiplanar CT image reconstructions were also generated. RADIATION DOSE REDUCTION: This exam was performed according to the  departmental dose-optimization program which includes automated exposure control, adjustment of the mA and/or kV according to patient size and/or use of iterative reconstruction technique. COMPARISON:  Head CT today. FINDINGS: Alignment: Mild straightening of cervical lordosis. Cervicothoracic junction alignment is within normal limits. Bilateral posterior element alignment is within normal limits. Skull base and vertebrae: Bone mineralization is within normal limits for age. Visualized skull base is intact. No atlanto-occipital dissociation. C1 and C2 appear intact and aligned. No acute osseous abnormality identified. Soft tissues and spinal canal: No prevertebral fluid or swelling. No visible canal hematoma. Minor calcified cervical carotid atherosclerosis. Otherwise negative noncontrast visible neck soft tissues. Disc levels: Mild for age cervical spine degeneration. No CT evidence of spinal stenosis. Upper chest: Partially visible tortuous proximal great vessels with some  calcified atherosclerosis. Visible upper thoracic levels appear intact, clear lung apices. IMPRESSION: 1. No acute traumatic injury identified in the cervical spine. 2. Mild for age cervical spine degeneration. Electronically Signed   By: Odessa Fleming M.D.   On: 12/08/2022 05:39   DG Chest 2 View  Result Date: 12/08/2022 CLINICAL DATA:  Fall with left chest trauma. EXAM: CHEST - 2 VIEW COMPARISON:  None Available. FINDINGS: The cardiomediastinal silhouette is normal. There is mild calcification in the aortic arch. No pneumothorax. No pleural effusion is seen. The lungs are hypoinflated with linear atelectatic foci in the bases and mild asymmetric elevation of the right diaphragm. No focal pneumonia is seen or other acute lung process. There is a mildly displaced fracture of the posterior left third rib. There is a slight deformity of the posterior left fifth rib, which is difficult to characterize as to whether it is an acute slightly displaced  fracture or an old healed fracture. No other focal ribcage abnormality is seen. There is no spinal compression fracture. Visualized shoulder girdles are intact. IMPRESSION: Mildly displaced acute fracture of the posterior left third rib. Slight deformity of the posterior left fifth rib, which is difficult to characterize as to whether it is an acute slightly displaced fracture or an old healed fracture. No pneumothorax or pleural effusion. No other acute radiographic chest findings. Low inspiration on exam. Electronically Signed   By: Almira Bar M.D.   On: 12/08/2022 05:38   CT Head Wo Contrast  Result Date: 12/08/2022 CLINICAL DATA:  82 year old male status post syncope and fall from the back of a truck two mornings ago. Feeling worse now. EXAM: CT HEAD WITHOUT CONTRAST TECHNIQUE: Contiguous axial images were obtained from the base of the skull through the vertex without intravenous contrast. RADIATION DOSE REDUCTION: This exam was performed according to the departmental dose-optimization program which includes automated exposure control, adjustment of the mA and/or kV according to patient size and/or use of iterative reconstruction technique. COMPARISON:  Head CT 04/24/2022. FINDINGS: Brain: Cerebral volume is within normal limits for age. No midline shift, ventriculomegaly, mass effect, evidence of mass lesion, intracranial hemorrhage or evidence of cortically based acute infarction. Gray-white differentiation stable and within normal limits for age. Vascular: Calcified atherosclerosis at the skull base. No suspicious intracranial vascular hyperdensity. Skull: Stable, intact. Sinuses/Orbits: Tympanic cavities, Visualized paranasal sinuses and mastoids are clear. Other: No discrete orbit or scalp soft tissue injury. IMPRESSION: No recent traumatic injury identified. Stable and normal for age noncontrast Head CT. Electronically Signed   By: Odessa Fleming M.D.   On: 12/08/2022 05:37    Procedures Procedures     Medications Ordered in ED Medications  oxyCODONE-acetaminophen (PERCOCET/ROXICET) 5-325 MG per tablet 1 tablet (1 tablet Oral Given 12/08/22 1610)    ED Course/ Medical Decision Making/ A&P                             Medical Decision Making Amount and/or Complexity of Data Reviewed Radiology: ordered.  Risk Prescription drug management.   This patient presents to the ED for concern of syncope, injury, this involves an extensive number of treatment options, and is a complaint that carries with it a high risk of complications and morbidity.  I considered the following differential and admission for this acute, potentially life threatening condition.  The differential diagnosis includes traumatic head injury, rib fracture, pneumothorax, clavicle or scapular injury, finger injury  MDM:    This  is a 82 year old male who presents with left chest pain, thumb pain after an episode of syncope.  Reports history of syncopal episodes in the past.  Has previously been worked up and has had some changes in his blood pressure medicines to try to prevent these episodes.  He did have a prodrome.  EKG shows no evidence of arrhythmia.  CT head neck negative for traumatic injury.  Chest x-ray shows evidence of likely 1, potentially 2 rib fractures.  Patient was given pain medication and an incentive spirometer.  X-ray of the right hand shows a slight deformity of the distal aspect of the proximal phalanx of the first digit.  This is where the patient has discomfort and swelling.  Would favor this to be a true nondisplaced fracture.  Patient placed in a thumb spica.  We discussed incentive spirometry and pain control to prevent pneumonia given his rib fractures.  Regarding his syncope, x-ray shows no arrhythmia.  Chart indicates that he has had a Zio patch in the past.  Patient also had prodrome which supports prior history of vasovagal syncope.  Do not feel he needs further workup at this time.  (Labs,  imaging, consults)  Labs: I Ordered, and personally interpreted labs.  The pertinent results include: None  Imaging Studies ordered: I ordered imaging studies including CT head, cervical spine, chest x-ray, right thumb x-ray I independently visualized and interpreted imaging. I agree with the radiologist interpretation  Additional history obtained from wife at bedside.  External records from outside source obtained and reviewed including PCP notes, cardiology notes regarding syncope  Cardiac Monitoring: The patient was maintained on a cardiac monitor.  If on the cardiac monitor, I personally viewed and interpreted the cardiac monitored which showed an underlying rhythm of: Sinus rhythm  Reevaluation: After the interventions noted above, I reevaluated the patient and found that they have :improved  Social Determinants of Health:  lives independently  Disposition: Discharge  Co morbidities that complicate the patient evaluation  Past Medical History:  Diagnosis Date   Hypertension    Prostate cancer    Right bundle branch block      Medicines Meds ordered this encounter  Medications   oxyCODONE-acetaminophen (PERCOCET/ROXICET) 5-325 MG per tablet 1 tablet   oxyCODONE-acetaminophen (PERCOCET/ROXICET) 5-325 MG tablet    Sig: Take 1 tablet by mouth every 6 (six) hours as needed for severe pain.    Dispense:  15 tablet    Refill:  0    I have reviewed the patients home medicines and have made adjustments as needed  Problem List / ED Course: Problem List Items Addressed This Visit   None Visit Diagnoses     Vasovagal syncope    -  Primary   Closed fracture of multiple ribs of left side, initial encounter       Closed nondisplaced fracture of proximal phalanx of right thumb, initial encounter                       Final Clinical Impression(s) / ED Diagnoses Final diagnoses:  Vasovagal syncope  Closed fracture of multiple ribs of left side, initial  encounter  Closed nondisplaced fracture of proximal phalanx of right thumb, initial encounter    Rx / DC Orders ED Discharge Orders          Ordered    oxyCODONE-acetaminophen (PERCOCET/ROXICET) 5-325 MG tablet  Every 6 hours PRN        12/08/22 0347  Shon Baton, MD 12/08/22 [phone]

## 2022-12-08 NOTE — Discharge Instructions (Signed)
You were seen today after having an episode of syncope and sustaining injuries.  You do have at least 1 and likely 2 rib fractures.  It is important that you use the incentive spirometer 4-6 times a day.  Take pain medication.  Monitor for signs and symptoms of pneumonia such as cough or fever.  Regarding your likely thumb fracture.  Follow-up with hand surgery.  Keep thumb spica in place in the meantime.

## 2022-12-08 NOTE — Progress Notes (Signed)
RT instructed the Pt on IS. He was able to do 1700

## 2022-12-08 NOTE — ED Notes (Signed)
RT at bedside for incentive spirometry instructions.

## 2022-12-08 NOTE — ED Triage Notes (Addendum)
Pt states that he had a syncopal episode on Sat morning. States that he flipped backwards out of a truck landing on a cement curb. Was evaluated by EMS on Saturday, but did not seek any other medical care.  States that he is feeling worse this morning. Pt has an abrasion to the left side of his face, right thumb pain, c/o pain to anterior chest and left sided rib cage area pain. Pain with breathing at times on this left side. Denies sob at present. Lungs clear-but pt in obvious pain with movement, breathing.

## 2022-12-09 DIAGNOSIS — Z8546 Personal history of malignant neoplasm of prostate: Secondary | ICD-10-CM | POA: Diagnosis not present

## 2022-12-09 DIAGNOSIS — G4733 Obstructive sleep apnea (adult) (pediatric): Secondary | ICD-10-CM | POA: Diagnosis not present

## 2022-12-09 DIAGNOSIS — I10 Essential (primary) hypertension: Secondary | ICD-10-CM | POA: Diagnosis not present

## 2022-12-09 DIAGNOSIS — E785 Hyperlipidemia, unspecified: Secondary | ICD-10-CM | POA: Diagnosis not present

## 2022-12-09 DIAGNOSIS — E669 Obesity, unspecified: Secondary | ICD-10-CM | POA: Diagnosis not present

## 2022-12-09 DIAGNOSIS — Z882 Allergy status to sulfonamides status: Secondary | ICD-10-CM | POA: Diagnosis not present

## 2022-12-09 DIAGNOSIS — Z8249 Family history of ischemic heart disease and other diseases of the circulatory system: Secondary | ICD-10-CM | POA: Diagnosis not present

## 2022-12-09 DIAGNOSIS — Z91199 Patient's noncompliance with other medical treatment and regimen due to unspecified reason: Secondary | ICD-10-CM | POA: Diagnosis not present

## 2022-12-09 DIAGNOSIS — Z6831 Body mass index (BMI) 31.0-31.9, adult: Secondary | ICD-10-CM | POA: Diagnosis not present

## 2022-12-10 DIAGNOSIS — M79644 Pain in right finger(s): Secondary | ICD-10-CM | POA: Diagnosis not present

## 2022-12-10 DIAGNOSIS — S62511D Displaced fracture of proximal phalanx of right thumb, subsequent encounter for fracture with routine healing: Secondary | ICD-10-CM | POA: Diagnosis not present

## 2022-12-10 DIAGNOSIS — S62514A Nondisplaced fracture of proximal phalanx of right thumb, initial encounter for closed fracture: Secondary | ICD-10-CM | POA: Diagnosis not present

## 2022-12-16 DIAGNOSIS — M79644 Pain in right finger(s): Secondary | ICD-10-CM | POA: Diagnosis not present

## 2022-12-16 DIAGNOSIS — S62511D Displaced fracture of proximal phalanx of right thumb, subsequent encounter for fracture with routine healing: Secondary | ICD-10-CM | POA: Diagnosis not present

## 2022-12-17 DIAGNOSIS — I452 Bifascicular block: Secondary | ICD-10-CM | POA: Diagnosis not present

## 2022-12-17 DIAGNOSIS — J342 Deviated nasal septum: Secondary | ICD-10-CM | POA: Diagnosis not present

## 2022-12-17 DIAGNOSIS — I4510 Unspecified right bundle-branch block: Secondary | ICD-10-CM | POA: Diagnosis not present

## 2022-12-17 DIAGNOSIS — E7800 Pure hypercholesterolemia, unspecified: Secondary | ICD-10-CM | POA: Diagnosis not present

## 2022-12-17 DIAGNOSIS — I10 Essential (primary) hypertension: Secondary | ICD-10-CM | POA: Diagnosis not present

## 2022-12-17 DIAGNOSIS — G4733 Obstructive sleep apnea (adult) (pediatric): Secondary | ICD-10-CM | POA: Diagnosis not present

## 2022-12-31 DIAGNOSIS — S62511D Displaced fracture of proximal phalanx of right thumb, subsequent encounter for fracture with routine healing: Secondary | ICD-10-CM | POA: Diagnosis not present

## 2022-12-31 DIAGNOSIS — M79644 Pain in right finger(s): Secondary | ICD-10-CM | POA: Diagnosis not present

## 2023-01-06 DIAGNOSIS — R55 Syncope and collapse: Secondary | ICD-10-CM | POA: Diagnosis not present

## 2023-01-07 ENCOUNTER — Non-Acute Institutional Stay (INDEPENDENT_AMBULATORY_CARE_PROVIDER_SITE_OTHER): Payer: Medicare HMO | Admitting: Family Medicine

## 2023-01-07 ENCOUNTER — Encounter: Payer: Self-pay | Admitting: Family Medicine

## 2023-01-07 VITALS — BP 138/74 | HR 74 | Temp 97.8°F | Ht 66.0 in | Wt 198.0 lb

## 2023-01-07 DIAGNOSIS — G2581 Restless legs syndrome: Secondary | ICD-10-CM

## 2023-01-07 DIAGNOSIS — R55 Syncope and collapse: Secondary | ICD-10-CM

## 2023-01-07 NOTE — Progress Notes (Signed)
Provider:  Jacalyn Lefevre, MD  Careteam: Patient Care Team: Frederica Kuster, MD as PCP - General (Family Medicine)  PLACE OF SERVICE:  [facility]  Advanced Directive information    Allergies  Allergen Reactions   Lamisil [Terbinafine]    Sulfa Antibiotics     No chief complaint on file.    HPI: Patient is a 82 y.o. male for medical management of chronic problems including blood pressure, several recent episodes of syncope.  He has seen cardiology at [facility] lisinopril was reduced from 20 to 10 mg and a also had evaluation with tilt table that was negative. Syncope is not related to palpitations or dizziness.  Typically related to doing some strenuous work but he also works out in [employer] here and his heart rate up to 80% of predicted and has never had an episode there. Also wonders if he could increase dose of ropinirole for restless legs  Review of Systems:  Review of Systems  Constitutional: Negative.   HENT: Negative.    Respiratory: Negative.    Genitourinary: Negative.   Neurological:  Positive for loss of consciousness.  Psychiatric/Behavioral: Negative.    All other systems reviewed and are negative.   Past Medical History:  Diagnosis Date   Hypertension    Prostate cancer (HCC)    Right bundle branch block    Past Surgical History:  Procedure Laterality Date   NOSE SURGERY  12/12/2019   PROSTATE SURGERY  2005   [facility]   Social History:   reports that he has never smoked. He has never used smokeless tobacco. He reports that he does not drink alcohol. No history on file for drug use.  Family History  Problem Relation Age of Onset   Congestive Heart Failure Mother     Medications: Patient's Medications  New Prescriptions   No medications on file  Previous Medications   AMLODIPINE (NORVASC) 5 MG TABLET    Take 1 tablet (5 mg total) by mouth daily.   ATORVASTATIN (LIPITOR) 10 MG TABLET    Take 1 tablet (10 mg total) by  mouth daily.   CHOLECALCIFEROL (VITAMIN D3 PO)    Take 50 mcg by mouth daily.   LISINOPRIL (ZESTRIL) 10 MG TABLET    Take 10 mg by mouth daily.   LORAZEPAM (ATIVAN) 0.5 MG TABLET    Take 1 tablet (0.5 mg total) by mouth 2 (two) times daily as needed for anxiety.   ROPINIROLE (REQUIP) 0.5 MG TABLET    Take 0.5 mg by mouth as needed.  Modified Medications   No medications on file  Discontinued Medications   LISINOPRIL (ZESTRIL) 20 MG TABLET    Take 1 tablet (20 mg total) by mouth daily.   OXYCODONE-ACETAMINOPHEN (PERCOCET/ROXICET) 5-325 MG TABLET    Take 1 tablet by mouth every 6 (six) hours as needed for severe pain.    Physical Exam:  There were no vitals filed for this visit. There is no height or weight on file to calculate BMI. Wt Readings from Last 3 Encounters:  12/08/22 197 lb (89.4 kg)  11/20/22 198 lb (89.8 kg)  09/17/22 197 lb 3.2 oz (89.4 kg)    Physical Exam Vitals and nursing note reviewed.  Constitutional:      Appearance: Normal appearance.  Cardiovascular:     Rate and Rhythm: Normal rate and regular rhythm.     Pulses: Normal pulses.     Heart sounds: Normal heart sounds.  Pulmonary:  Effort: Pulmonary effort is normal.     Breath sounds: Normal breath sounds.  Neurological:     General: No focal deficit present.     Mental Status: He is alert and oriented to person, place, and time.     Labs reviewed: Basic Metabolic Panel: Recent Labs    05/14/22 1239 09/18/22 0733  NA 140 141  K 3.9 4.1  CL 103 107  CO2 25 26  GLUCOSE 133* 102*  BUN 21 22  CREATININE 1.38* 1.40*  CALCIUM 9.0 8.6   Liver Function Tests: Recent Labs    09/18/22 0733  AST 15  ALT 16  BILITOT 0.5  PROT 5.7*   No results for input(s): "LIPASE", "AMYLASE" in the last 8760 hours. No results for input(s): "AMMONIA" in the last 8760 hours. CBC: Recent Labs    05/14/22 1239  WBC 8.8  HGB 12.6*  HCT 40.6  MCV 78.5*  PLT 358   Lipid Panel: Recent Labs     09/18/22 0733  CHOL 131  HDL 47  LDLCALC 67  TRIG 89  CHOLHDL 2.8   TSH: No results for input(s): "TSH" in the last 8760 hours. A1C: Lab Results  Component Value Date   HGBA1C 5.4 12/27/2019     Assessment/Plan  1. Restless leg syndrome Have given patient permission to titrate dose of ropinirole.  He currently has 0.5 has used 1 mg.  We discussed titrating upward by 0.5 mg at a time up to 2 mg  2. Syncope and collapse Etiology is still uncertain.  Encouraged staying hydrated and eating regularly preferably more protein than carbohydrate   Jacalyn Lefevre, MD [facility] [phone]

## 2023-01-09 ENCOUNTER — Other Ambulatory Visit: Payer: Self-pay | Admitting: Family Medicine

## 2023-01-09 DIAGNOSIS — G4700 Insomnia, unspecified: Secondary | ICD-10-CM

## 2023-01-14 DIAGNOSIS — S62514A Nondisplaced fracture of proximal phalanx of right thumb, initial encounter for closed fracture: Secondary | ICD-10-CM | POA: Diagnosis not present

## 2023-02-11 DIAGNOSIS — S62514A Nondisplaced fracture of proximal phalanx of right thumb, initial encounter for closed fracture: Secondary | ICD-10-CM | POA: Diagnosis not present

## 2023-03-11 ENCOUNTER — Other Ambulatory Visit: Payer: Self-pay | Admitting: Family Medicine

## 2023-03-17 DIAGNOSIS — R55 Syncope and collapse: Secondary | ICD-10-CM | POA: Diagnosis not present

## 2023-03-19 ENCOUNTER — Other Ambulatory Visit: Payer: Self-pay | Admitting: Family Medicine

## 2023-03-25 DIAGNOSIS — S62511D Displaced fracture of proximal phalanx of right thumb, subsequent encounter for fracture with routine healing: Secondary | ICD-10-CM | POA: Diagnosis not present

## 2023-03-31 DIAGNOSIS — L570 Actinic keratosis: Secondary | ICD-10-CM | POA: Diagnosis not present

## 2023-03-31 DIAGNOSIS — L821 Other seborrheic keratosis: Secondary | ICD-10-CM | POA: Diagnosis not present

## 2023-03-31 DIAGNOSIS — L814 Other melanin hyperpigmentation: Secondary | ICD-10-CM | POA: Diagnosis not present

## 2023-06-03 DIAGNOSIS — H43813 Vitreous degeneration, bilateral: Secondary | ICD-10-CM | POA: Diagnosis not present

## 2023-06-03 DIAGNOSIS — H11153 Pinguecula, bilateral: Secondary | ICD-10-CM | POA: Diagnosis not present

## 2023-06-03 DIAGNOSIS — H5201 Hypermetropia, right eye: Secondary | ICD-10-CM | POA: Diagnosis not present

## 2023-06-03 DIAGNOSIS — H11123 Conjunctival concretions, bilateral: Secondary | ICD-10-CM | POA: Diagnosis not present

## 2023-06-03 DIAGNOSIS — Z961 Presence of intraocular lens: Secondary | ICD-10-CM | POA: Diagnosis not present

## 2023-06-03 DIAGNOSIS — H0288A Meibomian gland dysfunction right eye, upper and lower eyelids: Secondary | ICD-10-CM | POA: Diagnosis not present

## 2023-06-03 DIAGNOSIS — H04123 Dry eye syndrome of bilateral lacrimal glands: Secondary | ICD-10-CM | POA: Diagnosis not present

## 2023-06-03 DIAGNOSIS — H524 Presbyopia: Secondary | ICD-10-CM | POA: Diagnosis not present

## 2023-06-11 ENCOUNTER — Other Ambulatory Visit: Payer: Self-pay | Admitting: Medical Genetics

## 2023-06-11 DIAGNOSIS — Z006 Encounter for examination for normal comparison and control in clinical research program: Secondary | ICD-10-CM

## 2023-06-12 ENCOUNTER — Encounter: Payer: Medicare HMO | Admitting: Sports Medicine

## 2023-06-15 ENCOUNTER — Other Ambulatory Visit: Payer: Self-pay | Admitting: *Deleted

## 2023-06-15 DIAGNOSIS — E7800 Pure hypercholesterolemia, unspecified: Secondary | ICD-10-CM

## 2023-06-15 MED ORDER — ATORVASTATIN CALCIUM 10 MG PO TABS: 10.0000 mg | ORAL_TABLET | Freq: Every day | ORAL | 1 refills | Status: DC

## 2023-06-15 NOTE — Telephone Encounter (Signed)
[facility] requested refill.   Pended Rx and sent to Dr. Jacquenette Shone for approval due to HIGH ALERT Warning.

## 2023-06-19 ENCOUNTER — Encounter: Payer: Self-pay | Admitting: Sports Medicine

## 2023-06-19 ENCOUNTER — Non-Acute Institutional Stay (SKILLED_NURSING_FACILITY): Payer: Medicare HMO | Admitting: Sports Medicine

## 2023-06-19 VITALS — BP 140/82 | HR 67 | Temp 97.5°F | Resp 17 | Ht 66.0 in | Wt 194.2 lb

## 2023-06-19 DIAGNOSIS — G2581 Restless legs syndrome: Secondary | ICD-10-CM

## 2023-06-19 DIAGNOSIS — I10 Essential (primary) hypertension: Secondary | ICD-10-CM

## 2023-06-19 DIAGNOSIS — F5101 Primary insomnia: Secondary | ICD-10-CM | POA: Diagnosis not present

## 2023-06-19 MED ORDER — TRAZODONE HCL 50 MG PO TABS
50.0000 mg | ORAL_TABLET | Freq: Every day | ORAL | 0 refills | Status: DC
Start: 2023-06-19 — End: 2023-07-13

## 2023-06-19 MED ORDER — ROPINIROLE HCL 1 MG PO TABS
1.0000 mg | ORAL_TABLET | Freq: Every day | ORAL | 0 refills | Status: DC
Start: 2023-06-19 — End: 2023-07-13

## 2023-06-19 NOTE — Progress Notes (Signed)
Careteam: Patient Care Team: Venita Sheffield, MD as PCP - General (Internal Medicine)  PLACE OF SERVICE:  [facility] CLINIC  Advanced Directive information Does Patient Have a Medical Advance Directive?: Yes, Type of Advance Directive: Healthcare Power of [place];Living will, Does patient want to make changes to medical advance directive?: No - Patient declined  Allergies  Allergen Reactions   Lamisil [Terbinafine]    Sulfa Antibiotics     Chief Complaint  Patient presents with   Medical Management of Chronic Issues    Patient wants to meet new Dr and discuss medications. Lisinopril has changed, wants to discuss Lorazepam/Ropinirole. Also wants to discuss Most Form/DNR.     HPI: Patient is a 82 y.o. male is here for follow up    HTN  Does exercise 5 times/ week  Does swim twice a  week  Tries to be active On amlodipine , lisinopril   Syncopal episode  No more episodes since last episode Followed with cardiology   Denies joint pains No problems  with his balance No recent falls    Restless leg syndrome Feels like band around his feet  Has bunion  Pt states his biggest problem is restless and not able to sleep at night  He takes ativan twice a week to help him sleep  Denies being anxious Says he is not nervous and has no problem relaxing   Review of Systems:  Review of Systems  Constitutional:  Negative for chills and fever.  HENT:  Negative for congestion and sore throat.   Eyes:  Negative for double vision.  Respiratory:  Negative for cough, sputum production and shortness of breath.   Cardiovascular:  Negative for chest pain, palpitations and leg swelling.  Gastrointestinal:  Negative for abdominal pain, heartburn and nausea.  Genitourinary:  Negative for dysuria, frequency and hematuria.  Musculoskeletal:  Negative for falls and myalgias.  Neurological:  Negative for dizziness, sensory change and focal weakness.  Psychiatric/Behavioral:  The patient  has insomnia.     Past Medical History:  Diagnosis Date   Hypertension    Prostate cancer (HCC)    Right bundle branch block    Past Surgical History:  Procedure Laterality Date   NOSE SURGERY  12/12/2019   PROSTATE SURGERY  2005   [facility]ball   Social History:   reports that he has never smoked. He has never used smokeless tobacco. He reports that he does not drink alcohol. No history on file for drug use.  Family History  Problem Relation Age of Onset   Congestive Heart Failure Mother     Medications: Patient's Medications  New Prescriptions   No medications on file  Previous Medications   AMLODIPINE (NORVASC) 5 MG TABLET    TAKE 1 TABLET (5 MG TOTAL) BY MOUTH DAILY.   ATORVASTATIN (LIPITOR) 10 MG TABLET    Take 1 tablet (10 mg total) by mouth daily.   CHOLECALCIFEROL (VITAMIN D3 PO)    Take 50 mcg by mouth daily.   LISINOPRIL (ZESTRIL) 10 MG TABLET    Take 10 mg by mouth daily.   LORAZEPAM (ATIVAN) 0.5 MG TABLET    TAKE 1 TABLET BY MOUTH 2 TIMES DAILY AS NEEDED FOR ANXIETY.   ROPINIROLE (REQUIP) 0.5 MG TABLET    TAKE 1 TABLET BY MOUTH AT BEDTIME.  Modified Medications   No medications on file  Discontinued Medications   ITRACONAZOLE (SPORANOX) 100 MG CAPSULE    Take 100 mg by mouth daily.  Physical Exam:  Vitals:   06/19/23 1105  BP: (!) 160/86  Pulse: 67  Resp: 17  Temp: (!) 97.5 F (36.4 C)  SpO2: 99%  Weight: 194 lb 3.2 oz (88.1 kg)  Height: 5\' 6"  (1.676 m)   Body mass index is 31.34 kg/m. Wt Readings from Last 3 Encounters:  06/19/23 194 lb 3.2 oz (88.1 kg)  01/07/23 198 lb (89.8 kg)  12/08/22 197 lb (89.4 kg)    Physical Exam Constitutional:      Appearance: Normal appearance.  HENT:     Head: Normocephalic and atraumatic.  Cardiovascular:     Rate and Rhythm: Normal rate and regular rhythm.     Pulses: Normal pulses.     Heart sounds: Normal heart sounds.  Pulmonary:     Effort: No respiratory distress.     Breath sounds: No stridor.  No wheezing or rales.  Abdominal:     General: Bowel sounds are normal. There is no distension.     Palpations: Abdomen is soft.     Tenderness: There is no abdominal tenderness. There is no right CVA tenderness or guarding.  Musculoskeletal:        General: No swelling.  Neurological:     Mental Status: He is alert. Mental status is at baseline.     Sensory: No sensory deficit.     Motor: No weakness.    Labs reviewed: Basic Metabolic Panel: Recent Labs    09/18/22 0733  NA 141  K 4.1  CL 107  CO2 26  GLUCOSE 102*  BUN 22  CREATININE 1.40*  CALCIUM 8.6   Liver Function Tests: Recent Labs    09/18/22 0733  AST 15  ALT 16  BILITOT 0.5  PROT 5.7*   No results for input(s): "LIPASE", "AMYLASE" in the last 8760 hours. No results for input(s): "AMMONIA" in the last 8760 hours. CBC: No results for input(s): "WBC", "NEUTROABS", "HGB", "HCT", "MCV", "PLT" in the last 8760 hours. Lipid Panel: Recent Labs    09/18/22 0733  CHOL 131  HDL 47  LDLCALC 67  TRIG 89  CHOLHDL 2.8   TSH: No results for input(s): "TSH" in the last 8760 hours. A1C: Lab Results  Component Value Date   HGBA1C 5.4 12/27/2019     Assessment/Plan  1. Primary insomnia Discussed regarding sleep hygiene Will start trazodone Avoid day time naps  Will follow up in 1 month   2. Restless leg syndrome Will increase requip to 1 mg   3. Essential hypertension Repeat bp improved Instructed patient to monitor bp, keep a log  Avoid salty foods  No follow-ups on file.:     I spent greater than  40 minutes for the care of this patient in face to face time, chart review, clinical documentation, patient education.

## 2023-07-11 ENCOUNTER — Other Ambulatory Visit: Payer: Self-pay | Admitting: Sports Medicine

## 2023-07-11 DIAGNOSIS — F5101 Primary insomnia: Secondary | ICD-10-CM

## 2023-07-11 DIAGNOSIS — G2581 Restless legs syndrome: Secondary | ICD-10-CM

## 2023-07-24 ENCOUNTER — Encounter: Payer: Self-pay | Admitting: Sports Medicine

## 2023-07-24 ENCOUNTER — Non-Acute Institutional Stay: Payer: Medicare HMO | Admitting: Sports Medicine

## 2023-07-24 VITALS — BP 127/78 | HR 67 | Temp 97.5°F | Resp 18 | Ht 66.0 in | Wt 195.0 lb

## 2023-07-24 DIAGNOSIS — G2581 Restless legs syndrome: Secondary | ICD-10-CM | POA: Diagnosis not present

## 2023-07-24 DIAGNOSIS — J309 Allergic rhinitis, unspecified: Secondary | ICD-10-CM | POA: Diagnosis not present

## 2023-07-24 DIAGNOSIS — F5101 Primary insomnia: Secondary | ICD-10-CM

## 2023-07-24 DIAGNOSIS — Z131 Encounter for screening for diabetes mellitus: Secondary | ICD-10-CM | POA: Diagnosis not present

## 2023-07-24 DIAGNOSIS — N1831 Chronic kidney disease, stage 3a: Secondary | ICD-10-CM

## 2023-07-24 MED ORDER — TRAZODONE HCL 50 MG PO TABS
75.0000 mg | ORAL_TABLET | Freq: Every evening | ORAL | 3 refills | Status: DC | PRN
Start: 2023-07-24 — End: 2024-03-09

## 2023-07-24 NOTE — Progress Notes (Signed)
Careteam: Patient Care Team: Venita Sheffield, MD as PCP - General (Internal Medicine)  PLACE OF SERVICE:  [facility]  Advanced Directive information    Allergies  Allergen Reactions   Lamisil [Terbinafine]    Sulfa Antibiotics     Chief Complaint  Patient presents with   Medical Management of Chronic Issues    1 month follow up   Immunizations    Influenza     HPI: Patient is a 82 y.o. male is here for follow up   Insomnia Goes to sleep around 11 pm and wakes up 2-3 hrs  He is able to go back to sleep some times  Wakes up 2-3 times to urinate  States he is sleeping better than he used to     Restless leg syndrome  States he is doing better  On requip 1 mg   Allergic rhinitis  Doing better with flonase States his nose clogges at night time     Review of Systems:  Review of Systems  Constitutional:  Negative for chills and fever.  HENT:  Negative for congestion and sore throat.   Eyes:  Negative for double vision.  Respiratory:  Negative for cough, sputum production and shortness of breath.   Cardiovascular:  Negative for chest pain, palpitations and leg swelling.  Gastrointestinal:  Negative for abdominal pain, heartburn and nausea.  Genitourinary:  Negative for dysuria, frequency and hematuria.  Musculoskeletal:  Negative for falls and myalgias.  Neurological:  Negative for dizziness, sensory change and focal weakness.  Psychiatric/Behavioral:  The patient has insomnia.    Negative unless indicated in HPI.   Past Medical History:  Diagnosis Date   Hypertension    Prostate cancer (HCC)    Right bundle branch block    Past Surgical History:  Procedure Laterality Date   NOSE SURGERY  12/12/2019   PROSTATE SURGERY  2005   [facility]ball   Social History:   reports that he has never smoked. He has never used smokeless tobacco. He reports that he does not drink alcohol. No history on file for drug use.  Family History  Problem Relation Age  of Onset   Congestive Heart Failure Mother     Medications: Patient's Medications  New Prescriptions   No medications on file  Previous Medications   AMLODIPINE (NORVASC) 5 MG TABLET    TAKE 1 TABLET (5 MG TOTAL) BY MOUTH DAILY.   ATORVASTATIN (LIPITOR) 10 MG TABLET    Take 1 tablet (10 mg total) by mouth daily.   CHOLECALCIFEROL (VITAMIN D3 PO)    Take 50 mcg by mouth daily.   LISINOPRIL (ZESTRIL) 10 MG TABLET    Take 10 mg by mouth daily.   LORAZEPAM (ATIVAN) 0.5 MG TABLET    TAKE 1 TABLET BY MOUTH 2 TIMES DAILY AS NEEDED FOR ANXIETY.   ROPINIROLE (REQUIP) 1 MG TABLET    TAKE 1 TABLET BY MOUTH AT BEDTIME.   TRAZODONE (DESYREL) 50 MG TABLET    TAKE 1 TABLET BY MOUTH EVERYDAY AT BEDTIME  Modified Medications   No medications on file  Discontinued Medications   No medications on file    Physical Exam: Vitals:   07/24/23 0920  BP: 127/78  Pulse: 67  Resp: 18  Temp: (!) 97.5 F (36.4 C)  SpO2: 97%  Weight: 195 lb (88.5 kg)  Height: 5\' 6"  (1.676 m)   Body mass index is 31.47 kg/m. BP Readings from Last 3 Encounters:  07/24/23 127/78  06/19/23 Marland Kitchen)  140/82  01/07/23 138/74   Wt Readings from Last 3 Encounters:  07/24/23 195 lb (88.5 kg)  06/19/23 194 lb 3.2 oz (88.1 kg)  01/07/23 198 lb (89.8 kg)    Physical Exam Constitutional:      Appearance: Normal appearance.  HENT:     Head: Normocephalic and atraumatic.  Cardiovascular:     Rate and Rhythm: Normal rate and regular rhythm.     Pulses: Normal pulses.     Heart sounds: Normal heart sounds.  Pulmonary:     Effort: No respiratory distress.     Breath sounds: No stridor. No wheezing or rales.  Abdominal:     General: Bowel sounds are normal. There is no distension.     Palpations: Abdomen is soft.     Tenderness: There is no abdominal tenderness. There is no right CVA tenderness or guarding.  Musculoskeletal:        General: No swelling.  Neurological:     Mental Status: He is alert. Mental status is at  baseline.     Sensory: No sensory deficit.     Motor: No weakness.     Labs reviewed: Basic Metabolic Panel: Recent Labs    09/18/22 0733  NA 141  K 4.1  CL 107  CO2 26  GLUCOSE 102*  BUN 22  CREATININE 1.40*  CALCIUM 8.6   Liver Function Tests: Recent Labs    09/18/22 0733  AST 15  ALT 16  BILITOT 0.5  PROT 5.7*   No results for input(s): "LIPASE", "AMYLASE" in the last 8760 hours. No results for input(s): "AMMONIA" in the last 8760 hours. CBC: No results for input(s): "WBC", "NEUTROABS", "HGB", "HCT", "MCV", "PLT" in the last 8760 hours. Lipid Panel: Recent Labs    09/18/22 0733  CHOL 131  HDL 47  LDLCALC 67  TRIG 89  CHOLHDL 2.8   TSH: No results for input(s): "TSH" in the last 8760 hours. A1C: Lab Results  Component Value Date   HGBA1C 5.4 12/27/2019     Assessment/Plan 1. Primary insomnia Will increase trazodone to 75mg   Discussed regarding sleep hygiene - traZODone (DESYREL) 50 MG tablet; Take 1.5 tablets (75 mg total) by mouth at bedtime as needed for sleep.  Dispense: 135 tablet; Refill: 3  2. Restless leg syndrome Doing better  Cont with requip  3. Allergic rhinitis, unspecified seasonality, unspecified trigger Instructed patient to take claritin  Cont with flonase  4. Screening for diabetes mellitus  - Hemoglobin A1C  5. Stage 3a chronic kidney disease (HCC) Avoid neprotoxic meds Will check bmp - Basic Metabolic Panel with eGFR   No follow-ups on file.:

## 2023-08-03 ENCOUNTER — Other Ambulatory Visit: Payer: Self-pay | Admitting: *Deleted

## 2023-08-03 MED ORDER — AMLODIPINE BESYLATE 5 MG PO TABS: 5.0000 mg | ORAL_TABLET | Freq: Every day | ORAL | 1 refills | Status: DC

## 2023-08-03 NOTE — Telephone Encounter (Signed)
Pharmacy requested refill

## 2023-08-04 ENCOUNTER — Other Ambulatory Visit: Payer: Medicare HMO

## 2023-08-05 LAB — BASIC METABOLIC PANEL WITH GFR
BUN/Creatinine Ratio: 13 (calc) (ref 6–22)
BUN: 18 mg/dL (ref 7–25)
CO2: 26 mmol/L (ref 20–32)
Calcium: 8.6 mg/dL (ref 8.6–10.3)
Chloride: 106 mmol/L (ref 98–110)
Creat: 1.39 mg/dL — ABNORMAL HIGH (ref 0.70–1.22)
Glucose, Bld: 91 mg/dL (ref 65–99)
Potassium: 4.1 mmol/L (ref 3.5–5.3)
Sodium: 140 mmol/L (ref 135–146)
eGFR: 51 mL/min/{1.73_m2} — ABNORMAL LOW (ref >=60)

## 2023-08-05 LAB — HEMOGLOBIN A1C
Hgb A1c MFr Bld: 6.1 %{Hb} — ABNORMAL HIGH (ref <5.7)
Mean Plasma Glucose: 128 mg/dL
eAG (mmol/L): 7.1 mmol/L

## 2023-08-15 ENCOUNTER — Encounter: Payer: Self-pay | Admitting: Sports Medicine

## 2023-08-17 NOTE — Telephone Encounter (Signed)
Message routed to PCP Venita Sheffield, MD

## 2023-08-20 ENCOUNTER — Telehealth: Payer: Self-pay

## 2023-08-20 NOTE — Telephone Encounter (Signed)
Patient would like the cologuard ordered

## 2023-08-21 NOTE — Telephone Encounter (Signed)
Message routed to PCP Venita Sheffield, MD

## 2023-08-21 NOTE — Telephone Encounter (Signed)
 Message copy and pasted to patient via MyChart message. For future references click "Reply to patient" in upper right corner. Message routed to PCP Venita Sheffield, MD

## 2023-08-27 NOTE — Telephone Encounter (Signed)
 Attempted to call patient about aging out of the colo-guard. Left message for patient to return phone call to the office

## 2023-09-01 ENCOUNTER — Telehealth: Payer: Self-pay

## 2023-09-01 ENCOUNTER — Encounter: Payer: Self-pay | Admitting: Sports Medicine

## 2023-09-01 ENCOUNTER — Other Ambulatory Visit: Payer: Self-pay | Admitting: Sports Medicine

## 2023-09-01 DIAGNOSIS — Z1211 Encounter for screening for malignant neoplasm of colon: Secondary | ICD-10-CM

## 2023-09-01 NOTE — Telephone Encounter (Signed)
 Patient call to discussed provider response. Patient stated that the insurance is requiring for colonoscopy and paying thirty dollars for the colonoscopy.  Please Advise  Message sent to Venita Sheffield, MD

## 2023-09-01 NOTE — Telephone Encounter (Signed)
 Shared Venita Sheffield, MD response with wife  Plz call the patient and inform that After 77 yrs we do not order colonoscopy as the risk of bowel perforation is higher unless there is cancer or bleeding. Will not recommend colonoscopy.

## 2023-09-01 NOTE — Progress Notes (Signed)
 Cologuard ordered

## 2023-09-01 NOTE — Telephone Encounter (Signed)
 error

## 2023-09-02 NOTE — Progress Notes (Signed)
 Noted.

## 2023-09-07 DIAGNOSIS — Z1211 Encounter for screening for malignant neoplasm of colon: Secondary | ICD-10-CM | POA: Diagnosis not present

## 2023-09-16 ENCOUNTER — Telehealth: Payer: Self-pay | Admitting: Sports Medicine

## 2023-09-16 DIAGNOSIS — R195 Other fecal abnormalities: Secondary | ICD-10-CM

## 2023-09-16 LAB — COLOGUARD: COLOGUARD: POSITIVE — AB

## 2023-09-17 ENCOUNTER — Encounter: Payer: Self-pay | Admitting: Sports Medicine

## 2023-09-17 NOTE — Telephone Encounter (Signed)
Attempted to call patient about his recent cologaurd

## 2023-09-18 ENCOUNTER — Encounter: Payer: Self-pay | Admitting: Internal Medicine

## 2023-09-29 DIAGNOSIS — L821 Other seborrheic keratosis: Secondary | ICD-10-CM | POA: Diagnosis not present

## 2023-09-29 DIAGNOSIS — L820 Inflamed seborrheic keratosis: Secondary | ICD-10-CM | POA: Diagnosis not present

## 2023-09-29 DIAGNOSIS — L570 Actinic keratosis: Secondary | ICD-10-CM | POA: Diagnosis not present

## 2023-10-16 ENCOUNTER — Encounter: Payer: Self-pay | Admitting: Sports Medicine

## 2023-10-16 ENCOUNTER — Ambulatory Visit
Admission: RE | Admit: 2023-10-16 | Discharge: 2023-10-16 | Disposition: A | Discharge status: Home / Self Care | Payer: Medicare HMO | Source: Ambulatory Visit | Attending: Sports Medicine | Admitting: Sports Medicine

## 2023-10-16 ENCOUNTER — Encounter: Payer: Medicare HMO | Admitting: Sports Medicine

## 2023-10-16 ENCOUNTER — Non-Acute Institutional Stay: Payer: Medicare HMO | Admitting: Sports Medicine

## 2023-10-16 VITALS — BP 131/88 | HR 78 | Temp 97.6°F | Resp 18 | Ht 66.0 in | Wt 192.2 lb

## 2023-10-16 DIAGNOSIS — R0981 Nasal congestion: Secondary | ICD-10-CM | POA: Diagnosis not present

## 2023-10-16 DIAGNOSIS — R051 Acute cough: Secondary | ICD-10-CM

## 2023-10-16 DIAGNOSIS — R509 Fever, unspecified: Secondary | ICD-10-CM | POA: Diagnosis not present

## 2023-10-16 MED ORDER — BENZONATATE 200 MG PO CAPS
200.0000 mg | ORAL_CAPSULE | Freq: Two times a day (BID) | ORAL | 0 refills | Status: AC | PRN
Start: 2023-10-16 — End: ?

## 2023-10-16 NOTE — Progress Notes (Signed)
 Careteam: Patient Care Team: Patrick Sheffield, MD as PCP - General (Internal Medicine)  PLACE OF SERVICE:  [facility]  Advanced Directive information    Allergies  Allergen Reactions   Lamisil [Terbinafine]    Sulfa Antibiotics     Chief Complaint  Patient presents with   Acute Visit    restless legs     Discussed the use of AI scribe software for clinical note transcription with the patient, who gave verbal consent to proceed.  History of Present Illness         The patient presents with a persistent cough and congestion.  The cough began approximately one week ago and is productive, with clear phlegm and occasional slight discoloration, but no yellow, brown, or blood. There is no shortness of breath during activities such as walking to the dining room. He has been using Robitussin, which helps somewhat with congestion, but he still experiences a deep cough.  Nasal congestion is particularly bothersome at night, with no significant sinus drainage or pain. He has been using Flonase nightly, which sometimes helps but never fully clears both nasal passages. He has also tried Claritin inconsistently, which he feels works better than [employer]. There is no history of asthma or COPD.  He experienced a low-grade fever, around 99 degrees, for two days, with the last occurrence being yesterday. Initially, he thought he had the flu due to symptoms of eye pain and body aches, but these symptoms did not worsen. No nausea, vomiting, or changes in appetite.  He is concerned about participating in a swimming event on Monday and traveling to [place] next week, worried about the impact of his current symptoms on these activities.      Review of Systems:  Review of Systems  Constitutional:  Negative for chills and fever.  HENT:  Positive for congestion. Negative for sore throat.   Eyes:  Negative for double vision.  Respiratory:  Positive for cough and sputum production. Negative for  shortness of breath.   Cardiovascular:  Negative for chest pain, palpitations and leg swelling.  Gastrointestinal:  Negative for abdominal pain, heartburn and nausea.  Genitourinary:  Negative for dysuria, frequency and hematuria.  Musculoskeletal:  Negative for falls and myalgias.  Neurological:  Negative for dizziness, sensory change and focal weakness.   Negative unless indicated in HPI.   Past Medical History:  Diagnosis Date   Hypertension    Prostate cancer (HCC)    Right bundle branch block    Past Surgical History:  Procedure Laterality Date   NOSE SURGERY  12/12/2019   PROSTATE SURGERY  2005   [facility]ball   Social History:   reports that he has never smoked. He has never used smokeless tobacco. He reports that he does not drink alcohol. No history on file for drug use.  Family History  Problem Relation Age of Onset   Congestive Heart Failure Mother     Medications: Patient's Medications  New Prescriptions   BENZONATATE (TESSALON) 200 MG CAPSULE    Take 1 capsule (200 mg total) by mouth 2 (two) times daily as needed for cough.  Previous Medications   AMLODIPINE (NORVASC) 5 MG TABLET    Take 1 tablet (5 mg total) by mouth daily.   ATORVASTATIN (LIPITOR) 10 MG TABLET    Take 1 tablet (10 mg total) by mouth daily.   CHOLECALCIFEROL (VITAMIN D3 PO)    Take 50 mcg by mouth daily.   LISINOPRIL (ZESTRIL) 10 MG TABLET  Take 10 mg by mouth daily.   LORAZEPAM (ATIVAN) 0.5 MG TABLET    TAKE 1 TABLET BY MOUTH 2 TIMES DAILY AS NEEDED FOR ANXIETY.   MAGNESIUM GLUCONATE (MAGONATE) 500 MG TABLET    Take 500 mg by mouth 2 (two) times daily.   ROPINIROLE (REQUIP) 1 MG TABLET    TAKE 1 TABLET BY MOUTH AT BEDTIME.   TRAZODONE (DESYREL) 50 MG TABLET    Take 1.5 tablets (75 mg total) by mouth at bedtime as needed for sleep.  Modified Medications   No medications on file  Discontinued Medications   No medications on file    Physical Exam: Vitals:   10/16/23 0911  BP: 131/88   Pulse: 78  Resp: 18  Temp: 97.6 F (36.4 C)  SpO2: 94%  Weight: 192 lb 3.2 oz (87.2 kg)  Height: 5\' 6"  (1.676 m)   Body mass index is 31.02 kg/m. BP Readings from Last 3 Encounters:  10/16/23 131/88  07/24/23 127/78  06/19/23 (!) 140/82   Wt Readings from Last 3 Encounters:  10/16/23 192 lb 3.2 oz (87.2 kg)  07/24/23 195 lb (88.5 kg)  06/19/23 194 lb 3.2 oz (88.1 kg)    Physical Exam Constitutional:      Appearance: Normal appearance.  HENT:     Head: Normocephalic and atraumatic.     Nose:     Comments: No sinus tenderness Cardiovascular:     Rate and Rhythm: Normal rate and regular rhythm.     Pulses: Normal pulses.     Heart sounds: Normal heart sounds.  Pulmonary:     Effort: No respiratory distress.     Breath sounds: No stridor. No wheezing or rales.  Abdominal:     General: Bowel sounds are normal. There is no distension.     Palpations: Abdomen is soft.     Tenderness: There is no abdominal tenderness. There is no right CVA tenderness or guarding.  Musculoskeletal:        General: No swelling.  Neurological:     Mental Status: He is alert. Mental status is at baseline.     Labs reviewed: Basic Metabolic Panel: Recent Labs    08/04/23 0719  NA 140  K 4.1  CL 106  CO2 26  GLUCOSE 91  BUN 18  CREATININE 1.39*  CALCIUM 8.6   Liver Function Tests: No results for input(s): "AST", "ALT", "ALKPHOS", "BILITOT", "PROT", "ALBUMIN" in the last 8760 hours. No results for input(s): "LIPASE", "AMYLASE" in the last 8760 hours. No results for input(s): "AMMONIA" in the last 8760 hours. CBC: No results for input(s): "WBC", "NEUTROABS", "HGB", "HCT", "MCV", "PLT" in the last 8760 hours. Lipid Panel: No results for input(s): "CHOL", "HDL", "LDLCALC", "TRIG", "CHOLHDL", "LDLDIRECT" in the last 8760 hours. TSH: No results for input(s): "TSH" in the last 8760 hours. A1C: Lab Results  Component Value Date   HGBA1C 6.1 (H) 08/04/2023    Assessment and  Plan      1. Acute cough (Primary) Patient presents with a week-long history of cough, phlegm production, and low-grade fever. No shortness of breath or other respiratory distress.  No flu or COVID detected.  Chest auscultation revealed coarse breath sounds at lung bases. -Order chest x-ray    Will cbc -Discontinue Robitussin and start Tessalon for cough. - DG Chest 2 View; Future - benzonatate (TESSALON) 200 MG capsule; Take 1 capsule (200 mg total) by mouth 2 (two) times daily as needed for cough.  Dispense: 20 capsule; Refill:  0 - CBC With Differential/Platelet - Basic Metabolic Panel with eGFR  2. Nasal congestion Will start flonase, claritin  Other orders - magnesium gluconate (MAGONATE) 500 MG tablet; Take 500 mg by mouth 2 (two) times daily.       30 minTotal time spent for obtaining history,  performing a medically appropriate examination and evaluation, reviewing the tests,ordering  tests,  documenting clinical information in the electronic or other health record,care coordination (not separately reported)

## 2023-10-20 ENCOUNTER — Other Ambulatory Visit: Payer: Medicare HMO

## 2023-10-20 DIAGNOSIS — R051 Acute cough: Secondary | ICD-10-CM | POA: Diagnosis not present

## 2023-10-20 LAB — CBC WITH DIFFERENTIAL/PLATELET
Absolute Lymphocytes: 1699 {cells}/uL (ref 850–3900)
Absolute Monocytes: 564 {cells}/uL (ref 200–950)
Basophils Absolute: 31 {cells}/uL (ref 0–200)
Basophils Relative: 0.5 %
Eosinophils Absolute: 198 {cells}/uL (ref 15–500)
Eosinophils Relative: 3.2 %
HCT: 43.7 % (ref 38.5–50.0)
Hemoglobin: 13.4 g/dL (ref 13.2–17.1)
MCH: 23.1 pg — ABNORMAL LOW (ref 27.0–33.0)
MCHC: 30.7 g/dL — ABNORMAL LOW (ref 32.0–36.0)
MCV: 75.2 fL — ABNORMAL LOW (ref 80.0–100.0)
MPV: 10.1 fL (ref 7.5–12.5)
Monocytes Relative: 9.1 %
Neutro Abs: 3708 {cells}/uL (ref 1500–7800)
Neutrophils Relative %: 59.8 %
Platelets: 187 10*3/uL (ref 140–400)
RBC: 5.81 10*6/uL — ABNORMAL HIGH (ref 4.20–5.80)
RDW: 19.4 % — ABNORMAL HIGH (ref 11.0–15.0)
Total Lymphocyte: 27.4 %
WBC: 6.2 10*3/uL (ref 3.8–10.8)

## 2023-10-20 LAB — BASIC METABOLIC PANEL WITH GFR
BUN: 17 mg/dL (ref 7–25)
CO2: 26 mmol/L (ref 20–32)
Calcium: 8.5 mg/dL — ABNORMAL LOW (ref 8.6–10.3)
Chloride: 105 mmol/L (ref 98–110)
Creat: 1.21 mg/dL (ref 0.70–1.22)
Glucose, Bld: 95 mg/dL (ref 65–99)
Potassium: 4.1 mmol/L (ref 3.5–5.3)
Sodium: 139 mmol/L (ref 135–146)
eGFR: 61 mL/min/{1.73_m2} (ref >=60)

## 2023-10-21 ENCOUNTER — Encounter: Payer: Medicare HMO | Admitting: Sports Medicine

## 2023-10-21 ENCOUNTER — Ambulatory Visit: Payer: Medicare HMO | Admitting: Internal Medicine

## 2023-10-22 ENCOUNTER — Encounter: Payer: Self-pay | Admitting: Internal Medicine

## 2023-11-24 DIAGNOSIS — L821 Other seborrheic keratosis: Secondary | ICD-10-CM | POA: Diagnosis not present

## 2023-11-24 DIAGNOSIS — L814 Other melanin hyperpigmentation: Secondary | ICD-10-CM | POA: Diagnosis not present

## 2023-11-24 DIAGNOSIS — L905 Scar conditions and fibrosis of skin: Secondary | ICD-10-CM | POA: Diagnosis not present

## 2023-11-24 DIAGNOSIS — L820 Inflamed seborrheic keratosis: Secondary | ICD-10-CM | POA: Diagnosis not present

## 2023-12-03 ENCOUNTER — Non-Acute Institutional Stay: Payer: Medicare HMO | Admitting: Nurse Practitioner

## 2023-12-07 NOTE — Progress Notes (Signed)
 This encounter was created in error - please disregard.

## 2023-12-12 ENCOUNTER — Other Ambulatory Visit: Payer: Self-pay | Admitting: Sports Medicine

## 2023-12-12 DIAGNOSIS — E7800 Pure hypercholesterolemia, unspecified: Secondary | ICD-10-CM

## 2023-12-29 DIAGNOSIS — L821 Other seborrheic keratosis: Secondary | ICD-10-CM | POA: Diagnosis not present

## 2023-12-29 DIAGNOSIS — L814 Other melanin hyperpigmentation: Secondary | ICD-10-CM | POA: Diagnosis not present

## 2023-12-29 DIAGNOSIS — D1801 Hemangioma of skin and subcutaneous tissue: Secondary | ICD-10-CM | POA: Diagnosis not present

## 2023-12-29 DIAGNOSIS — L820 Inflamed seborrheic keratosis: Secondary | ICD-10-CM | POA: Diagnosis not present

## 2024-01-06 ENCOUNTER — Ambulatory Visit: Admitting: Internal Medicine

## 2024-01-06 ENCOUNTER — Encounter: Payer: Self-pay | Admitting: Internal Medicine

## 2024-01-06 ENCOUNTER — Other Ambulatory Visit: Payer: Self-pay | Admitting: Sports Medicine

## 2024-01-06 VITALS — BP 132/70 | HR 74 | Ht 66.0 in | Wt 195.0 lb

## 2024-01-06 DIAGNOSIS — R195 Other fecal abnormalities: Secondary | ICD-10-CM

## 2024-01-06 DIAGNOSIS — G2581 Restless legs syndrome: Secondary | ICD-10-CM

## 2024-01-06 NOTE — Progress Notes (Signed)
 HISTORY OF PRESENT ILLNESS:  Patrick [name] is a 83 y.o. male, retired Education officer, environmental, who was sent today by his primary care physician regarding positive Cologuard testing.  The patient is new to this practice.  Patient reports having had colonoscopy on 2 occasions in [place] .  Not sure with whom.  He states that the examinations were without polyps and 10 years apart.  His last examination was about 10 years ago.  He was notified to have follow-up screening colonoscopy.  He elected for Cologuard testing which was performed January 2025.  This returned positive.  He has moved to this area and presents regarding this finding.  Patient denies any GI symptoms.  No family history of colon cancer.  Outside blood work from October 20, 2023 shows a normal hemoglobin of 13.4.  REVIEW OF SYSTEMS:  All non-GI ROS negative entirely.    Past Medical History:  Diagnosis Date   Hypertension    Prostate cancer (HCC)    Right bundle branch block     Past Surgical History:  Procedure Laterality Date   NOSE SURGERY  12/12/2019   PROSTATE SURGERY  2005   [name]    Social History Patrick [name]  reports that he has never smoked. He has never used smokeless tobacco. He reports that he does not drink alcohol. No history on file for drug use.  family history includes Congestive Heart Failure in his mother.  Allergies  Allergen Reactions   Lamisil [Terbinafine]    Sulfa Antibiotics        PHYSICAL EXAMINATION: Vital signs: BP 132/70   Pulse 74   Ht 5\' 6"  (1.676 m)   Wt 195 lb (88.5 kg)   BMI 31.47 kg/m   Constitutional: generally well-appearing, no acute distress Psychiatric: alert and oriented x3, cooperative Eyes: extraocular movements intact, anicteric, conjunctiva pink Mouth: oral pharynx moist, no lesions Neck: supple no lymphadenopathy Cardiovascular: heart regular rate and rhythm, no murmur Lungs: clear to auscultation bilaterally Abdomen: soft, nontender,  nondistended, no obvious ascites, no peritoneal signs, normal bowel sounds, no organomegaly Rectal: Omitted Extremities: no clubbing, cyanosis, or lower extremity edema bilaterally Skin: no lesions on visible extremities Neuro: No focal deficits.  Cranial nerves intact  ASSESSMENT:  1.  Positive Cologuard testing 2.  Asymptomatic with normal hemoglobin 3.  Reports 2 prior colonoscopies elsewhere, last one 10 years ago, without polyps.   PLAN:  1.  I discussed with the patient in great detail the nature of Cologuard testing.  I discussed with him the implications of a positive test in terms of false positive rate as well as positive findings.  I discussed with him that my recommendation would be for optical colonoscopy, particularly since he is a very healthy active 83 year old.  Despite this EXTENSIVE imbalance discussion, he has elected not to proceed with colonoscopy.  I did question why he did Cologuard testing in the first place.  He tells me it was recommended.  Is not clear that he understood the implications of positive testing.  In any event, he certainly understands of the biggest risk of not proceeding with colonoscopy would be the development of colon cancer.  He accepts this risk.  However, I did tell him that he is welcome to reconsider.  In which case, he can contact our office to schedule--assuming his health status remains favorable.  Otherwise he will return to the care of his primary provider. A total time of 45 minutes was spent preparing to see the patient, obtaining  comprehensive history, performing medically appropriate physical exam, counseling and educating the patient regarding the above listed issues, and documenting clinical information in the health record

## 2024-01-06 NOTE — Patient Instructions (Signed)
 Please follow up as needed.  _______________________________________________________  If your blood pressure at your visit was 140/90 or greater, please contact your primary care physician to follow up on this.  _______________________________________________________  If you are age 81 or older, your body mass index should be between 23-30. Your Body mass index is 31.47 kg/m. If this is out of the aforementioned range listed, please consider follow up with your Primary Care Provider.  If you are age 82 or younger, your body mass index should be between 19-25. Your Body mass index is 31.47 kg/m. If this is out of the aformentioned range listed, please consider follow up with your Primary Care Provider.   ________________________________________________________  The [facility] providers would like to encourage you to use MYCHART to communicate with providers for non-urgent requests or questions.  Due to long hold times on the telephone, sending your provider a message by [facility] may be a faster and more efficient way to get a response.  Please allow 48 business hours for a response.  Please remember that this is for non-urgent requests.  _______________________________________________________

## 2024-02-04 ENCOUNTER — Encounter: Payer: Self-pay | Admitting: Family Medicine

## 2024-02-04 ENCOUNTER — Ambulatory Visit (INDEPENDENT_AMBULATORY_CARE_PROVIDER_SITE_OTHER)

## 2024-02-04 ENCOUNTER — Ambulatory Visit: Admitting: Family Medicine

## 2024-02-04 VITALS — BP 122/80 | HR 71 | Ht 66.0 in | Wt 191.0 lb

## 2024-02-04 DIAGNOSIS — M545 Low back pain, unspecified: Secondary | ICD-10-CM

## 2024-02-04 DIAGNOSIS — M47816 Spondylosis without myelopathy or radiculopathy, lumbar region: Secondary | ICD-10-CM | POA: Diagnosis not present

## 2024-02-04 DIAGNOSIS — G8929 Other chronic pain: Secondary | ICD-10-CM

## 2024-02-04 MED ORDER — OXYCODONE-ACETAMINOPHEN 5-325 MG PO TABS: 1.0000 | ORAL_TABLET | Freq: Three times a day (TID) | ORAL | 0 refills | Status: AC | PRN

## 2024-02-04 NOTE — Progress Notes (Signed)
   I, Patrick [name], CMA acting as a scribe for Patrick Juniper, MD.  Patrick [name] is a 83 y.o. male who presents to [facility] at [facility] today for low back pain. Pt was previously seen by Dr. Felipe [name] in 2022 for this issue.  Today, pt reports LBP flared up x 2 days. Pt locates pain to midline lower back. Denies radiating pain into the gluteal region, hips, groin, or legs. Denies n/t/w in B LE. Sx worse with prolonged sitting, worse with standing and ambulation. Denies night disturbance. Taking 2 Tylenol  QID with intermittent short-term relief.   Radiating pain: denies LE numbness/tingling: denies LE weakness: denies Aggravates: standing, ambulation Treatments tried: Tylenol   05/2020 - MRI L-spine  Pertinent review of systems: No fevers or chills  Relevant historical information: Hypertension.  Lumbar spondylosis and DDD Prostate cancer  Exam:  BP 122/80   Pulse 71   Ht 5' 6 (1.676 m)   Wt 191 lb (86.6 kg)   SpO2 95%   BMI 30.83 kg/m  [name]: Well Developed, well nourished, and in no acute distress.   MSK: Spine: Normal appearing Minimally tender palpation lower portion of L-spine midline. Very limited ROM of R range of motion.  Lower extremity strength is intact.    Lab and Radiology Results  X-ray images lumbar spine obtained today personally and independently interpreted. No acute fractures.  Facet DJD present. Await formal radiology review    Assessment and Plan: 83 y.o. male with acute exacerbation of chronic low back pain.  Pain due to muscle spasm and dysfunction.  Fortunately no fracture visible on x-ray per my read.  Radiology overread is still pending.  Plan for trial of physical therapy.  Limited oxycodone  prescribed as that has worked quite well for him in the past.  Check back if not improving.   PDMP reviewed during this encounter. Orders Placed This Encounter  Procedures   DG Lumbar Spine 2-3 Views    Standing Status:   Future     Number of Occurrences:   1    Expiration Date:   03/05/2024    Reason for Exam (SYMPTOM  OR DIAGNOSIS REQUIRED):   midline lower back pain    Preferred imaging location?:   [facility]   Ambulatory referral to Physical Therapy    Referral Priority:   Routine    Referral Type:   Physical Medicine    Referral Reason:   Specialty Services Required    Requested Specialty:   Physical Therapy    Number of Visits Requested:   1   Meds ordered this encounter  Medications   oxyCODONE -acetaminophen  (PERCOCET/ROXICET) 5-325 MG tablet    Sig: Take 1 tablet by mouth every 8 (eight) hours as needed for severe pain (pain score 7-10).    Dispense:  15 tablet    Refill:  0     Discussed warning signs or symptoms. Please see discharge instructions. Patient expresses understanding.   The above documentation has been reviewed and is accurate and complete Patrick [name], M.D.

## 2024-02-04 NOTE — Patient Instructions (Addendum)
 Thank you for coming in today.   Please get an Xray today before you leave   A referral for physical therapy has been submitted. A representative from the physical therapy office will contact you to coordinate scheduling after confirming your benefits with your insurance provider. If you do not hear from the physical therapy office within the next 1-2 weeks, please let us  know.   Try a heating pad for your back.  See you back as needed.

## 2024-02-15 ENCOUNTER — Telehealth: Payer: Self-pay | Admitting: Family Medicine

## 2024-02-15 ENCOUNTER — Ambulatory Visit: Payer: Self-pay | Admitting: Family Medicine

## 2024-02-15 NOTE — Telephone Encounter (Signed)
 Teresa Crews(nurse) from [facility] called to say that this patient had a referral for PT but she has not received the referral yet. She provided the fax number [phone]). She asked where it was sent before and I was unable to answer. She states that the PT does not have a fax number so it should go to her.

## 2024-02-15 NOTE — Progress Notes (Signed)
Lumbar spine x-ray shows a little bit of arthritis.

## 2024-02-15 NOTE — Telephone Encounter (Signed)
 Order/Referral faxed via EPIC to [facility] for PT.

## 2024-02-18 NOTE — Telephone Encounter (Signed)
 Referral printed and re-sent to [facility] via [facility].

## 2024-02-23 DIAGNOSIS — M5459 Other low back pain: Secondary | ICD-10-CM | POA: Diagnosis not present

## 2024-02-26 DIAGNOSIS — M5459 Other low back pain: Secondary | ICD-10-CM | POA: Diagnosis not present

## 2024-03-03 DIAGNOSIS — M5459 Other low back pain: Secondary | ICD-10-CM | POA: Diagnosis not present

## 2024-03-07 DIAGNOSIS — M5459 Other low back pain: Secondary | ICD-10-CM | POA: Diagnosis not present

## 2024-03-09 ENCOUNTER — Other Ambulatory Visit: Payer: Self-pay | Admitting: Sports Medicine

## 2024-03-09 DIAGNOSIS — E7800 Pure hypercholesterolemia, unspecified: Secondary | ICD-10-CM

## 2024-03-09 DIAGNOSIS — F5101 Primary insomnia: Secondary | ICD-10-CM

## 2024-03-09 NOTE — Telephone Encounter (Signed)
 Patient has request refill on medications that were only approved for 90 days last refill. Medications pend and sent to Roxan Plough, NP due to PCP Sherlynn Madden, MD being out of office.

## 2024-03-11 DIAGNOSIS — M5459 Other low back pain: Secondary | ICD-10-CM | POA: Diagnosis not present

## 2024-03-15 DIAGNOSIS — M5459 Other low back pain: Secondary | ICD-10-CM | POA: Diagnosis not present

## 2024-03-17 DIAGNOSIS — M5459 Other low back pain: Secondary | ICD-10-CM | POA: Diagnosis not present

## 2024-03-22 DIAGNOSIS — M5459 Other low back pain: Secondary | ICD-10-CM | POA: Diagnosis not present

## 2024-03-24 DIAGNOSIS — M5459 Other low back pain: Secondary | ICD-10-CM | POA: Diagnosis not present

## 2024-03-29 DIAGNOSIS — L821 Other seborrheic keratosis: Secondary | ICD-10-CM | POA: Diagnosis not present

## 2024-03-29 DIAGNOSIS — L814 Other melanin hyperpigmentation: Secondary | ICD-10-CM | POA: Diagnosis not present

## 2024-03-29 DIAGNOSIS — L820 Inflamed seborrheic keratosis: Secondary | ICD-10-CM | POA: Diagnosis not present

## 2024-06-06 ENCOUNTER — Telehealth: Payer: Self-pay | Admitting: *Deleted

## 2024-06-06 NOTE — Telephone Encounter (Signed)
 Copied from CRM #[id number]. Topic: General - Other >> Jun 06, 2024  1:44 PM Miquel SAILOR wrote: Reason for CRM: PT requesting cal back on who is the easiest to work with for claims on medical  insurance . Needs call back on office recommendations for names. [phone]   Tried to call patient, LMOM to [facility].

## 2024-06-09 DIAGNOSIS — H0288B Meibomian gland dysfunction left eye, upper and lower eyelids: Secondary | ICD-10-CM | POA: Diagnosis not present

## 2024-06-09 DIAGNOSIS — H04123 Dry eye syndrome of bilateral lacrimal glands: Secondary | ICD-10-CM | POA: Diagnosis not present

## 2024-06-09 DIAGNOSIS — H43813 Vitreous degeneration, bilateral: Secondary | ICD-10-CM | POA: Diagnosis not present

## 2024-06-09 DIAGNOSIS — H524 Presbyopia: Secondary | ICD-10-CM | POA: Diagnosis not present

## 2024-06-09 DIAGNOSIS — Z961 Presence of intraocular lens: Secondary | ICD-10-CM | POA: Diagnosis not present

## 2024-06-09 DIAGNOSIS — H11153 Pinguecula, bilateral: Secondary | ICD-10-CM | POA: Diagnosis not present

## 2024-06-09 DIAGNOSIS — H0288A Meibomian gland dysfunction right eye, upper and lower eyelids: Secondary | ICD-10-CM | POA: Diagnosis not present

## 2024-06-09 DIAGNOSIS — H11123 Conjunctival concretions, bilateral: Secondary | ICD-10-CM | POA: Diagnosis not present

## 2024-06-15 ENCOUNTER — Encounter: Payer: Self-pay | Admitting: Sports Medicine

## 2024-06-15 MED ORDER — LISINOPRIL 10 MG PO TABS: 10.0000 mg | ORAL_TABLET | Freq: Every day | ORAL | 1 refills | Status: AC

## 2024-06-15 NOTE — Telephone Encounter (Signed)
 Patrick Madden, MD are you ok with taking over rx?

## 2024-06-20 DIAGNOSIS — Z0100 Encounter for examination of eyes and vision without abnormal findings: Secondary | ICD-10-CM | POA: Diagnosis not present

## 2024-06-22 ENCOUNTER — Other Ambulatory Visit: Payer: Self-pay | Admitting: Medical Genetics

## 2024-06-22 DIAGNOSIS — Z006 Encounter for examination for normal comparison and control in clinical research program: Secondary | ICD-10-CM

## 2024-06-28 DIAGNOSIS — D1801 Hemangioma of skin and subcutaneous tissue: Secondary | ICD-10-CM | POA: Diagnosis not present

## 2024-06-28 DIAGNOSIS — L853 Xerosis cutis: Secondary | ICD-10-CM | POA: Diagnosis not present

## 2024-06-28 DIAGNOSIS — L570 Actinic keratosis: Secondary | ICD-10-CM | POA: Diagnosis not present

## 2024-06-28 DIAGNOSIS — L821 Other seborrheic keratosis: Secondary | ICD-10-CM | POA: Diagnosis not present

## 2024-06-28 DIAGNOSIS — L814 Other melanin hyperpigmentation: Secondary | ICD-10-CM | POA: Diagnosis not present

## 2024-06-28 DIAGNOSIS — L905 Scar conditions and fibrosis of skin: Secondary | ICD-10-CM | POA: Diagnosis not present

## 2024-06-30 ENCOUNTER — Other Ambulatory Visit: Payer: Self-pay | Admitting: Sports Medicine

## 2024-06-30 DIAGNOSIS — G2581 Restless legs syndrome: Secondary | ICD-10-CM

## 2024-07-11 LAB — GENECONNECT MOLECULAR SCREEN: Genetic Analysis Overall Interpretation: NEGATIVE

## 2024-07-17 DIAGNOSIS — G2581 Restless legs syndrome: Secondary | ICD-10-CM

## 2024-07-18 MED ORDER — ROPINIROLE HCL 1 MG PO TABS: 1.0000 mg | ORAL_TABLET | Freq: Every day | ORAL | 1 refills | Status: AC

## 2024-07-18 NOTE — Telephone Encounter (Signed)
 Patient requested refill for Ropinirole  .5mg  but we have 1mg  in current medication list.   Pended for [facility] approval.

## 2024-07-26 DIAGNOSIS — L578 Other skin changes due to chronic exposure to nonionizing radiation: Secondary | ICD-10-CM | POA: Diagnosis not present

## 2024-08-22 ENCOUNTER — Encounter: Payer: Self-pay | Admitting: Internal Medicine

## 2024-08-22 ENCOUNTER — Other Ambulatory Visit: Payer: Self-pay

## 2024-08-23 ENCOUNTER — Telehealth: Payer: Self-pay

## 2024-08-23 NOTE — Telephone Encounter (Signed)
 Lm on vm for patient to call back and schedule colonoscopy and previsit

## 2024-08-23 NOTE — Telephone Encounter (Signed)
 Lm on vm for patient to call back to schedule colonoscopy and previsit

## 2024-09-02 ENCOUNTER — Other Ambulatory Visit: Payer: Self-pay

## 2024-09-02 ENCOUNTER — Telehealth: Payer: Self-pay

## 2024-09-02 ENCOUNTER — Encounter

## 2024-09-02 DIAGNOSIS — R195 Other fecal abnormalities: Secondary | ICD-10-CM

## 2024-09-02 MED ORDER — NA SULFATE-K SULFATE-MG SULF 17.5-3.13-1.6 GM/177ML PO SOLN: 1.0000 | Freq: Once | ORAL | 0 refills | Status: AC

## 2024-09-02 NOTE — Telephone Encounter (Signed)
 Patient did not log on for his Pre-visit/video visit today. No-show letter will be sent to the patient.

## 2024-09-02 NOTE — Telephone Encounter (Signed)
 PT confirmed appointment for 1/23. Please send instructions to mychart and prep

## 2024-09-05 ENCOUNTER — Encounter: Payer: Self-pay | Admitting: Internal Medicine

## 2024-09-06 ENCOUNTER — Telehealth: Payer: Self-pay

## 2024-09-06 NOTE — Telephone Encounter (Signed)
 Spoke with patient regarding no show on pevisit scheduled for 09/02/24/.  Patient said that he reviewed instructions with Sonny Seltzer cma,  and that she had sent RX to pharmacy and he verbally understood all instructions and did not want to have a pre visit for upcoming colonoscopy.

## 2024-09-08 ENCOUNTER — Other Ambulatory Visit: Payer: Self-pay | Admitting: Family

## 2024-09-08 DIAGNOSIS — E7800 Pure hypercholesterolemia, unspecified: Secondary | ICD-10-CM

## 2024-09-08 NOTE — Telephone Encounter (Signed)
 Last office visit 10/16/2023. No upcoming visits have been scheduled please advise if ok to refill?

## 2024-09-09 ENCOUNTER — Ambulatory Visit: Admitting: Internal Medicine

## 2024-09-09 ENCOUNTER — Encounter: Payer: Self-pay | Admitting: Internal Medicine

## 2024-09-09 ENCOUNTER — Other Ambulatory Visit: Payer: Self-pay | Admitting: Sports Medicine

## 2024-09-09 VITALS — BP 147/81 | HR 65 | Temp 97.9°F | Resp 15 | Ht 66.0 in | Wt 195.0 lb

## 2024-09-09 DIAGNOSIS — K648 Other hemorrhoids: Secondary | ICD-10-CM

## 2024-09-09 DIAGNOSIS — D120 Benign neoplasm of cecum: Secondary | ICD-10-CM | POA: Diagnosis not present

## 2024-09-09 DIAGNOSIS — D124 Benign neoplasm of descending colon: Secondary | ICD-10-CM

## 2024-09-09 DIAGNOSIS — R195 Other fecal abnormalities: Secondary | ICD-10-CM

## 2024-09-09 DIAGNOSIS — Z1211 Encounter for screening for malignant neoplasm of colon: Secondary | ICD-10-CM | POA: Diagnosis present

## 2024-09-09 DIAGNOSIS — K5730 Diverticulosis of large intestine without perforation or abscess without bleeding: Secondary | ICD-10-CM | POA: Diagnosis not present

## 2024-09-09 DIAGNOSIS — D128 Benign neoplasm of rectum: Secondary | ICD-10-CM

## 2024-09-09 DIAGNOSIS — D123 Benign neoplasm of transverse colon: Secondary | ICD-10-CM

## 2024-09-09 DIAGNOSIS — F5101 Primary insomnia: Secondary | ICD-10-CM

## 2024-09-09 DIAGNOSIS — D122 Benign neoplasm of ascending colon: Secondary | ICD-10-CM

## 2024-09-09 MED ORDER — SODIUM CHLORIDE 0.9 % IV SOLN: 500.0000 mL | Freq: Once | INTRAVENOUS | Status: DC

## 2024-09-09 NOTE — Patient Instructions (Addendum)
 Resume previous diet Continue present medications Await pathology results See handouts for polyps and diverticulosis YOU HAD AN ENDOSCOPIC PROCEDURE TODAY AT THE [facility]:   Refer to the procedure report that was given to you for any specific questions about what was found during the examination.  If the procedure report does not answer your questions, please call your gastroenterologist to clarify.  If you requested that your care partner not be given the details of your procedure findings, then the procedure report has been included in a sealed envelope for you to review at your convenience later.  YOU SHOULD EXPECT: Some feelings of bloating in the abdomen. Passage of more gas than usual.  Walking can help get rid of the air that was put into your GI tract during the procedure and reduce the bloating. If you had a lower endoscopy (such as a colonoscopy or flexible sigmoidoscopy) you may notice spotting of blood in your stool or on the toilet paper. If you underwent a bowel prep for your procedure, you may not have a normal bowel movement for a few days.  Please Note:  You might notice some irritation and congestion in your nose or some drainage.  This is from the oxygen used during your procedure.  There is no need for concern and it should clear up in a day or so.  SYMPTOMS TO REPORT IMMEDIATELY:  Following lower endoscopy (colonoscopy or flexible sigmoidoscopy):  Excessive amounts of blood in the stool  Significant tenderness or worsening of abdominal pains  Swelling of the abdomen that is new, acute  Fever of 100F or higher  For urgent or emergent issues, a gastroenterologist can be reached at any hour by calling [phone]. Do not use MyChart messaging for urgent concerns.   DIET:  We do recommend a small meal at first, but then you may proceed to your regular diet.  Drink plenty of fluids but you should avoid alcoholic beverages for 24 hours.  ACTIVITY:  You should  plan to take it easy for the rest of today and you should NOT DRIVE or use heavy machinery until tomorrow (because of the sedation medicines used during the test).    FOLLOW UP: Our staff will call the number listed on your records the next business day following your procedure.  We will call around 7:15- 8:00 am to check on you and address any questions or concerns that you may have regarding the information given to you following your procedure. If we do not reach you, we will leave a message.     If any biopsies were taken you will be contacted by phone or by letter within the next 1-3 weeks.  Please call us  at [phone] if you have not heard about the biopsies in 3 weeks.   SIGNATURES/CONFIDENTIALITY: You and/or your care partner have signed paperwork which will be entered into your electronic medical record.  These signatures attest to the fact that that the information above on your After Visit Summary has been reviewed and is understood.  Full responsibility of the confidentiality of this discharge information lies with you and/or your care-partner.

## 2024-09-09 NOTE — Progress Notes (Signed)
 Called to room to assist during endoscopic procedure.  Patient ID and intended procedure confirmed with present staff. Received instructions for my participation in the procedure from the performing physician.

## 2024-09-09 NOTE — Progress Notes (Signed)
 Expand All Collapse All HISTORY OF PRESENT ILLNESS:   Patrick [name] is a 84 y.o. male, retired education officer, environmental, who was sent today by his primary care physician regarding positive Cologuard testing.  The patient is new to this practice.   Patient reports having had colonoscopy on 2 occasions in [facility] .  Not sure with whom.  He states that the examinations were without polyps and 10 years apart.  His last examination was about 10 years ago.  He was notified to have follow-up screening colonoscopy.  He elected for Cologuard testing which was performed January 2025.  This returned positive.  He has moved to this area and presents regarding this finding.   Patient denies any GI symptoms.  No family history of colon cancer.   Outside blood work from October 20, 2023 shows a normal hemoglobin of 13.4.   REVIEW OF SYSTEMS:   All non-GI ROS negative entirely.         Past Medical History:  Diagnosis Date   Hypertension     Prostate cancer (HCC)     Right bundle branch block                 Past Surgical History:  Procedure Laterality Date   NOSE SURGERY   12/12/2019   PROSTATE SURGERY   2005    [place] [facility]          Social History Patrick [name]  reports that he has never smoked. He has never used smokeless tobacco. He reports that he does not drink alcohol. No history on file for drug use.   family history includes Congestive Heart Failure in his mother.   Allergies      Allergies  Allergen Reactions   Lamisil [Terbinafine]     Sulfa Antibiotics              PHYSICAL EXAMINATION: Vital signs: BP 132/70   Pulse 74   Ht 5' 6 (1.676 m)   Wt 195 lb (88.5 kg)   BMI 31.47 kg/m   Constitutional: generally well-appearing, no acute distress Psychiatric: alert and oriented x3, cooperative Eyes: extraocular movements intact, anicteric, conjunctiva pink Mouth: oral pharynx moist, no lesions Neck: supple no lymphadenopathy Cardiovascular: heart regular rate and  rhythm, no murmur Lungs: clear to auscultation bilaterally Abdomen: soft, nontender, nondistended, no obvious ascites, no peritoneal signs, normal bowel sounds, no organomegaly Rectal: Omitted Extremities: no clubbing, cyanosis, or lower extremity edema bilaterally Skin: no lesions on visible extremities Neuro: No focal deficits.  Cranial nerves intact   ASSESSMENT:   1.  Positive Cologuard testing 2.  Asymptomatic with normal hemoglobin 3.  Reports 2 prior colonoscopies elsewhere, last one 10 years ago, without polyps.     PLAN:   1.  I discussed with the patient in great detail the nature of Cologuard testing.  I discussed with him the implications of a positive test in terms of false positive rate as well as positive findings.  I discussed with him that my recommendation would be for optical colonoscopy, particularly since he is a very healthy active 84 year old.  Despite this EXTENSIVE imbalance discussion, he has elected not to proceed with colonoscopy.  I did question why he did Cologuard testing in the first place.  He tells me it was recommended.  Is not clear that he understood the implications of positive testing.  In any event, he certainly understands of the biggest risk of not proceeding with colonoscopy would be the development of colon cancer.  He accepts this risk.  However, I did tell him that he is welcome to reconsider.  In which case, he can contact our office to schedule--assuming his health status remains favorable.  Otherwise he will return to the care of his primary provider.    Previous H&P as above.  The patient has decided to proceed with colonoscopy.  Not for that exam.

## 2024-09-09 NOTE — Op Note (Signed)
 [facility] Patient Name: Patrick [name] Procedure Date: 09/09/2024 10:23 AM MRN: [id number] Endoscopist: Norleen SAILOR. Abran , MD, [id number] Age: 84 Referring MD:  Date of Birth: 19-May-1941 Gender: Male Account #: [id number] Procedure:                Colonoscopy with EMR x 1; cold snare x 9; biopsy                            polypectomy x 1 Indications:              Positive Cologuard test Medicines:                Monitored Anesthesia Care Procedure:                Pre-Anesthesia Assessment:                           - Prior to the procedure, a History and Physical                            was performed, and patient medications and                            allergies were reviewed. The patient's tolerance of                            previous anesthesia was also reviewed. The risks                            and benefits of the procedure and the sedation                            options and risks were discussed with the patient.                            All questions were answered, and informed consent                            was obtained. Prior Anticoagulants: The patient has                            taken no anticoagulant or antiplatelet agents. ASA                            Grade Assessment: II - A patient with mild systemic                            disease. After reviewing the risks and benefits,                            the patient was deemed in satisfactory condition to                            undergo the procedure.  After obtaining informed consent, the colonoscope                            was passed under direct vision. Throughout the                            procedure, the patient's blood pressure, pulse, and                            oxygen saturations were monitored continuously. The                            CF [id number] #[id number] was introduced through the anus                            and advanced to the the  cecum, identified by                            appendiceal orifice and ileocecal valve. The                            ileocecal valve, appendiceal orifice, and rectum                            were photographed. The quality of the bowel                            preparation was excellent. The colonoscopy was                            performed without difficulty. The patient tolerated                            the procedure well. The bowel preparation used was                            SUPREP via split dose instruction. Scope In: 10:40:16 AM Scope Out: 11:03:44 AM Scope Withdrawal Time: 0 hours 21 minutes 31 seconds  Total Procedure Duration: 0 hours 23 minutes 28 seconds  Findings:                 A 15 mm polyp was found in the cecum. The polyp was                            sessile. The polyp was removed using endoscopic                            mucosal resection technique (the polyp was removed                            with a saline injection-lift technique followed by                            cold snare resection).  Resection and retrieval were                            complete.                           Nine polyps were found in the rectum, descending                            colon, transverse colon, ascending colon and cecum.                            The polyps were 3 to 10 mm in size. These polyps                            were removed with a cold snare. Resection and                            retrieval were complete.                           A 1 mm polyp was found in the transverse colon. The                            polyp was removed with a jumbo cold forceps.                            Resection and retrieval were complete.                           Multiple diverticula were found in the left colon.                           Internal hemorrhoids were found during retroflexion.                           The exam was otherwise without abnormality on                             direct and retroflexion views. Complications:            No immediate complications. Estimated blood loss:                            None. Estimated Blood Loss:     Estimated blood loss: none. Impression:               - One 15 mm polyp in the cecum, removed with EMR                            technique. Resected and retrieved.                           - Nine 3 to 10 mm polyps in the rectum, in the  descending colon, in the transverse colon, in the                            ascending colon and in the cecum, removed with a                            cold snare. Resected and retrieved.                           - One 1 mm polyp in the transverse colon, removed                            with a jumbo cold forceps. Resected and retrieved.                           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                           - Internal hemorrhoids.                           - The examination was otherwise normal on direct                            and retroflexion views. Recommendation:           - Repeat colonoscopy in 1 year for surveillance.                           - Patient has a contact number available for                            emergencies. The signs and symptoms of potential                            delayed complications were discussed with the                            patient. Return to normal activities tomorrow.                            Written discharge instructions were provided to the                            patient.                           - Resume previous diet.                           - Continue present medications.                           - Await pathology results. Norleen SAILOR. Abran, MD 09/09/2024 11:17:24 AM This report has been signed electronically.

## 2024-09-09 NOTE — Progress Notes (Signed)
 Vss nad trans to pacu

## 2024-09-13 ENCOUNTER — Telehealth: Payer: Self-pay

## 2024-09-13 NOTE — Telephone Encounter (Signed)
" °  Follow up Call-     09/09/2024    9:27 AM  Call back number  Post procedure Call Back phone  # [phone]  Permission to leave phone message Yes     Patient questions:  Do you have a fever, pain , or abdominal swelling? No. Pain Score  0 *  Have you tolerated food without any problems? Yes.    Have you been able to return to your normal activities? Yes.    Do you have any questions about your discharge instructions: Diet   No. Medications  No. Follow up visit  No.  Do you have questions or concerns about your Care? No.  Actions: * If pain score is 4 or above: No action needed, pain <4.   "

## 2024-09-14 ENCOUNTER — Ambulatory Visit: Payer: Self-pay | Admitting: Internal Medicine

## 2024-09-14 LAB — SURGICAL PATHOLOGY
